# Patient Record
Sex: Male | Born: 1975 | Race: Black or African American | Hispanic: No | Marital: Married | Smoking: Never smoker
Health system: Southern US, Community
[De-identification: ages and names within clinical notes are randomized; demographics above are authoritative.]

---

## 1999-01-16 ENCOUNTER — Emergency Department (HOSPITAL_COMMUNITY): Admission: EM | Admit: 1999-01-16 | Discharge: 1999-01-16 | Payer: Self-pay | Admitting: *Deleted

## 1999-01-16 ENCOUNTER — Encounter: Payer: Self-pay | Admitting: *Deleted

## 1999-01-25 ENCOUNTER — Emergency Department (HOSPITAL_COMMUNITY): Admission: EM | Admit: 1999-01-25 | Discharge: 1999-01-25 | Payer: Self-pay | Admitting: Emergency Medicine

## 2001-01-13 ENCOUNTER — Emergency Department (HOSPITAL_COMMUNITY): Admission: EM | Admit: 2001-01-13 | Discharge: 2001-01-13 | Payer: Self-pay | Admitting: Emergency Medicine

## 2003-11-12 ENCOUNTER — Emergency Department (HOSPITAL_COMMUNITY): Admission: EM | Admit: 2003-11-12 | Discharge: 2003-11-13 | Payer: Self-pay | Admitting: Emergency Medicine

## 2003-12-25 ENCOUNTER — Emergency Department (HOSPITAL_COMMUNITY): Admission: EM | Admit: 2003-12-25 | Discharge: 2003-12-25 | Payer: Self-pay | Admitting: Emergency Medicine

## 2004-12-13 ENCOUNTER — Emergency Department (HOSPITAL_COMMUNITY): Admission: EM | Admit: 2004-12-13 | Discharge: 2004-12-13 | Payer: Self-pay | Admitting: Family Medicine

## 2007-08-16 HISTORY — PX: VASECTOMY: SHX75

## 2010-05-04 ENCOUNTER — Emergency Department (HOSPITAL_COMMUNITY)
Admission: EM | Admit: 2010-05-04 | Discharge: 2010-05-04 | Payer: Self-pay | Source: Home / Self Care | Admitting: Family Medicine

## 2011-12-08 IMAGING — CR DG CHEST 2V
2 series · 2 of 2 positions shown · non-contrast
Comparison: None

CLINICAL DATA: The patient started getting sick last night.  Cough,
chest pain.  Bodyaches, fever of 101.

CHEST - 2 VIEW

[view not recorded (1 of 2)]
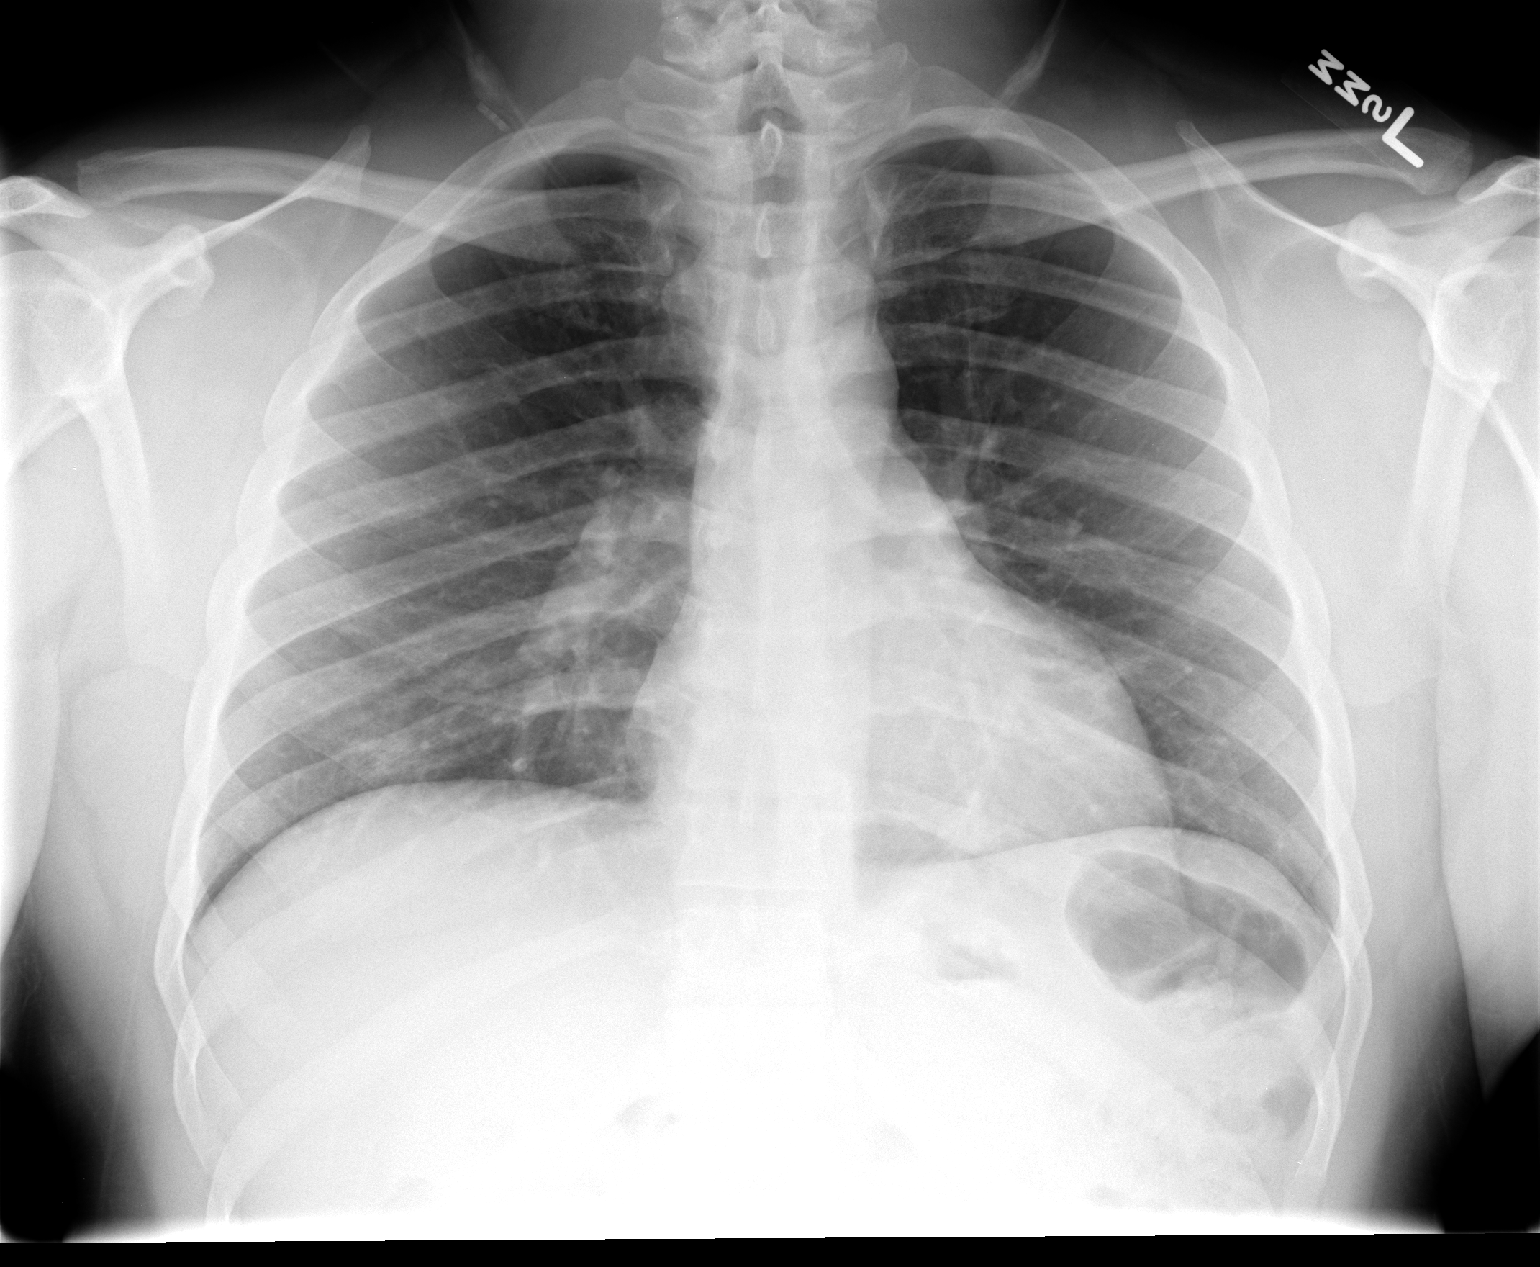

[view not recorded (2 of 2)]
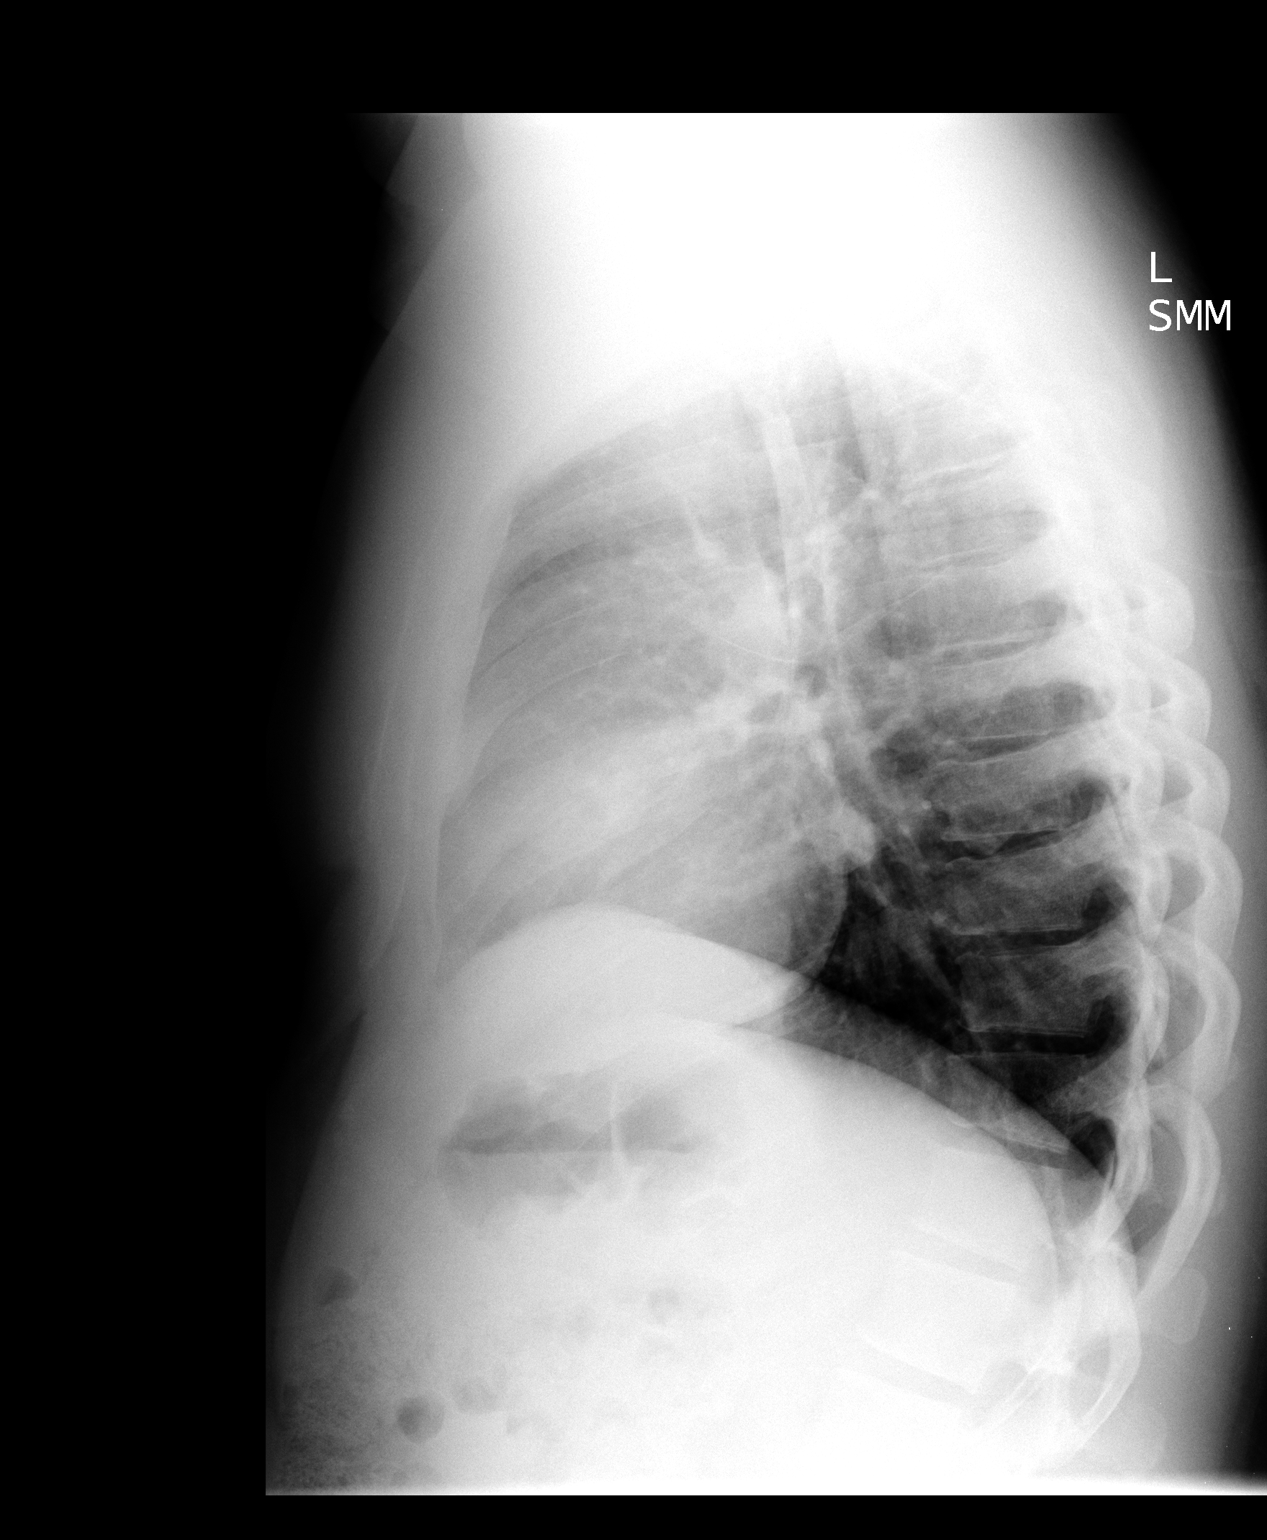

[2 of 2 positions shown; findings below may reference images not displayed]

FINDINGS: The heart is upper limits normal.  No pulmonary edema.
There are no focal consolidations or pleural effusions.  There is
mild wedging of T11 which can be a variant of normal.
IMPRESSION: No evidence for acute  abnormality.

## 2012-08-15 HISTORY — PX: OTHER SURGICAL HISTORY: SHX169

## 2012-10-01 ENCOUNTER — Encounter: Payer: Self-pay | Admitting: Family Medicine

## 2012-10-01 ENCOUNTER — Ambulatory Visit (INDEPENDENT_AMBULATORY_CARE_PROVIDER_SITE_OTHER): Payer: BC Managed Care – PPO | Admitting: Family Medicine

## 2012-10-01 VITALS — BP 112/74 | HR 72 | Temp 98.0°F | Ht 66.0 in | Wt 212.8 lb

## 2012-10-01 DIAGNOSIS — L989 Disorder of the skin and subcutaneous tissue, unspecified: Secondary | ICD-10-CM | POA: Insufficient documentation

## 2012-10-01 DIAGNOSIS — Z23 Encounter for immunization: Secondary | ICD-10-CM

## 2012-10-01 MED ORDER — DOXYCYCLINE HYCLATE 100 MG PO CAPS: 100.0000 mg | ORAL_CAPSULE | Freq: Two times a day (BID) | ORAL | Status: DC

## 2012-10-01 NOTE — Addendum Note (Signed)
Addended by: Josph Macho A on: 10/01/2012 10:20 AM   Modules accepted: Orders

## 2012-10-01 NOTE — Patient Instructions (Signed)
Tdap today. (Tetanus and pertussis) Trial of antibiotic for cysts. Pass by [place]'s office to set up appointment with local dermatologist. Good to see you today, call us with questions. Return in 1-2 yrs for physical, prior fasting for blood work.

## 2012-10-01 NOTE — Progress Notes (Signed)
Subjective:    Patient ID: Bradley Guerrero, male    DOB: [birth year], 37 y.o.   MRN: [id number]  HPI CC: new pt to establish  Boils in back of head, would like evaluated.  Present for several years.  Intermittently come and go.  Has seen dermatologist in past for several years who recommended oral antibiotics, didn't help.  Thinks saw [place].  Would like second opinion.  Uses pantene shampoo, no body lotion.  Uses dove and caress soaps.  Uses degree deodorant.  Uses light hair gel product (prior was using more greasy formula). Has also had boils in groin and under arms.  Usually start draining on their own.    Preventative: No recent CPE. Tetanus shot >10 yrs ago.  Would like Tdap today.  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  Past histories reviewed and updated if relevant as below. There is no problem list on file for this patient.  History reviewed. No pertinent past medical history. Past Surgical History  Procedure Laterality Date  . Vasectomy  2009   History  Substance Use Topics  . Smoking status: Never Smoker   . Smokeless tobacco: Never Used  . Alcohol Use: No   Family History  Problem Relation Age of Onset  . Diabetes Other     paternal great grandfather  . Hypertension Neg Hx   . CAD Neg Hx   . Stroke Neg Hx   . Cancer Neg Hx    No Known Allergies No current outpatient prescriptions on file prior to visit.   No current facility-administered medications on file prior to visit.     Review of Systems  Constitutional: Negative for fever, chills, activity change, appetite change, fatigue and unexpected weight change.  HENT: Negative for hearing loss and neck pain.   Eyes: Negative for visual disturbance.   Respiratory: Negative for cough, chest tightness, shortness of breath and wheezing.   Cardiovascular: Negative for chest pain, palpitations and leg swelling.  Gastrointestinal: Negative for nausea, vomiting, abdominal pain, diarrhea, constipation, blood in stool and abdominal distention.  Genitourinary: Negative for hematuria and difficulty urinating.  Musculoskeletal: Negative for myalgias and arthralgias.  Skin: Negative for rash.  Neurological: Negative for dizziness, seizures, syncope and headaches.  Hematological: Does not bruise/bleed easily.  Psychiatric/Behavioral: Negative for dysphoric mood. The patient is not nervous/anxious.        Objective:   Physical Exam  Nursing note and vitals reviewed. Constitutional: He is oriented to person, place, and time. He appears well-developed and well-nourished. No distress.  HENT:  Head: Normocephalic and atraumatic.  Right Ear: Hearing, tympanic membrane, external ear and ear canal normal.  Left Ear: Hearing, tympanic membrane, external ear and ear canal normal.  Nose: Nose normal.  Mouth/Throat: Oropharynx is clear and moist. No oropharyngeal exudate.  Eyes: Conjunctivae and EOM are normal. Pupils are equal, round, and reactive to light. No scleral icterus.  Neck: Normal range of motion. Neck supple. No thyromegaly present.  Cardiovascular: Normal rate, regular rhythm, normal heart sounds and intact distal pulses.   No murmur heard. Pulses:      Radial pulses are 2+ on the right side, and 2+ on the left side.  Pulmonary/Chest: Effort normal and breath sounds normal. No respiratory distress. He has no wheezes. He has no rales.  Abdominal: Soft. Bowel sounds are normal. He exhibits no distension and no mass. There is no tenderness. There is no rebound and no guarding.  Musculoskeletal: Normal range of motion. He exhibits no edema.  Lymphadenopathy:    He has no cervical adenopathy.  Neurological: He is alert and oriented to person,  place, and time.  CN grossly intact, station and gait intact  Skin: Skin is warm and dry. No rash noted.  Posterior scalp with several lumps, and pustules, some localized alopecia at larger area on right.  No erythema, tenderness, calor or induration L axilla indurated nodule and bilateral axilla with skin thickening present.  Psychiatric: He has a normal mood and affect. His behavior is normal. Judgment and thought content normal.       Assessment & Plan:

## 2012-10-01 NOTE — Assessment & Plan Note (Signed)
?  folliculitis vs hydradenitis variant. Treat as possible folliculitis with course of oral doxy x 10 days.  Discussed using antibacterial soap. Not too consistent with tinea capitis as no scaling. Discussed hair products. Refer back to derm for further recs/treatment plan.

## 2012-10-11 ENCOUNTER — Encounter: Payer: Self-pay | Admitting: Family Medicine

## 2012-10-11 ENCOUNTER — Encounter: Payer: Self-pay | Admitting: *Deleted

## 2012-10-11 ENCOUNTER — Ambulatory Visit (INDEPENDENT_AMBULATORY_CARE_PROVIDER_SITE_OTHER): Payer: BC Managed Care – PPO | Admitting: Family Medicine

## 2012-10-11 VITALS — BP 116/74 | HR 104 | Temp 100.5°F | Wt 215.2 lb

## 2012-10-11 DIAGNOSIS — J029 Acute pharyngitis, unspecified: Secondary | ICD-10-CM

## 2012-10-11 DIAGNOSIS — J020 Streptococcal pharyngitis: Secondary | ICD-10-CM | POA: Insufficient documentation

## 2012-10-11 LAB — POCT RAPID STREP A (OFFICE): Rapid Strep A Screen: POSITIVE — AB

## 2012-10-11 MED ORDER — AMOXICILLIN 875 MG PO TABS: 875.0000 mg | ORAL_TABLET | Freq: Two times a day (BID) | ORAL | Status: DC

## 2012-10-11 NOTE — Progress Notes (Signed)
  Subjective:    Patient ID: Bradley Guerrero, male    DOB: 05-13-1976, 37 y.o.   MRN: [id number]  HPI CC: URI sxs  1d h/o ST, fevers and chills.  Didn't check temp at home.  + congestion and PNdrainage.  + HA and right earache  So far has tried advil.  No abd pain, cough, tooth pain  No smokers at home. No h/o asthma. Did not receive flu shot this year. Wife with sinusitis on augmentin started this week, and daughter with stomach bug this week.  Currently completing 10d course of doxy for scalp folliculitis (started 10/01/2012).  To see derm tomorrow.  History reviewed. No pertinent past medical history.  Review of Systems Per HPI    Objective:   Physical Exam  Nursing note and vitals reviewed. Constitutional: He appears well-developed and well-nourished. No distress.  HENT:  Head: Normocephalic and atraumatic.  Right Ear: Hearing, tympanic membrane, external ear and ear canal normal.  Left Ear: Hearing, tympanic membrane, external ear and ear canal normal.  Nose: Nose normal. No mucosal edema or rhinorrhea. Right sinus exhibits no maxillary sinus tenderness and no frontal sinus tenderness. Left sinus exhibits no maxillary sinus tenderness and no frontal sinus tenderness.  Mouth/Throat: Uvula is midline and mucous membranes are normal. Oropharyngeal exudate, posterior oropharyngeal edema and posterior oropharyngeal erythema present. No tonsillar abscesses.  Swollen tonsils with exudates bilaterally  Eyes: Conjunctivae and EOM are normal. Pupils are equal, round, and reactive to light. No scleral icterus.  Neck: Normal range of motion. Neck supple.  Cardiovascular: Normal rate, regular rhythm, normal heart sounds and intact distal pulses.   No murmur heard. Pulmonary/Chest: Effort normal and breath sounds normal. No respiratory distress. He has no wheezes. He has no rales.  Lymphadenopathy:    He has cervical adenopathy (R AC LAD).      Assessment & Plan:

## 2012-10-11 NOTE — Patient Instructions (Addendum)
You have strep pharyngitis. Take amoxicillin twice daily for 10 days.  May stop doxycycline Push fluids and plenty of rest. May use ibuprofen for throat inflammation. Salt water gargles. Suck on cold things like popsicles or warm things like herbal teas (whichever soothes the throat better). Return if fever >101.5, worsening pain, or trouble opening/closing mouth, or hoarse voice. Good to see you today, call clinic with questions.

## 2012-10-11 NOTE — Assessment & Plan Note (Signed)
4/4 centor criteria. RST positive. Treat with amox. Stop doxy.

## 2012-10-11 NOTE — Addendum Note (Signed)
Addended by: Josph Macho A on: 10/11/2012 09:55 AM   Modules accepted: Orders

## 2012-10-15 ENCOUNTER — Telehealth: Payer: Self-pay | Admitting: Family Medicine

## 2012-10-15 NOTE — Telephone Encounter (Signed)
[facility] Triage Call Report Triage Record Num: [id number] Operator: Ether Griffins Patient Name: Bradley Guerrero Call Date & Time: 10/13/2012 9:19:18PM Patient Phone: [phone] PCP: Eustaquio Boyden Patient Gender: Male PCP Fax : [phone] Patient DOB: [birth year] [facility] Name: [facility] Reason for Call: Caller: [name]/Patient; PCP: Eustaquio Boyden [facility]); CB#: [phone]; Calling about sore throat not getting any better,white on the back of throat,painful to swallow. Saw PCP on 10/11/12--dx with Strep and was given Amoxicillin. Has taken antibx x 3 days. Also taking Ibuprofen without effect. Guideline: Sore Throat or Hoarseness. Disposition: See Lyrick Lagrand Within 4 Hours. Reason for Disposition: Marked difficulty swallowing due to sore throat unresponsive to 12 hours of home care. Contacted Dr Domenic Polite ordered Keflex 500 mg TID x 10 days(no refillls), and advised to take Ibuprofen 800 mg PO QID with food for the next 2 days. Called [facility] [phone] and spoke to pharmacist [place]. Home care advice given. Advised to take Keflex instead of Amoxicillin. Protocol(s) Used: Sore Throat or Hoarseness Recommended Outcome per Protocol: See Geremy Rister within 4 hours Override Outcome if Used in Protocol: Call Rosaline Ezekiel Immediately RN Reason for Override Outcome: Nursing Judgement Used. Reason for Outcome: Marked difficulty swallowing due to sore throat unresponsive to 12 hours of home care Care Advice: ~ SYMPTOM / CONDITION MANAGEMENT Analgesic/Antipyretic Advice - NSAIDs: Consider aspirin, ibuprofen, naproxen or ketoprofen for pain or fever as directed on label or by pharmacist/Amy Belloso. PRECAUTIONS: - If over 33 years of age, should not take longer than 1 week without consulting Sakiyah Shur. EXCEPTIONS: - Should not be used if taking blood thinners or have bleeding problems. - Do not use if have history of sensitivity/allergy to any of  these medications; or history of cardiovascular, ulcer, kidney, liver disease or diabetes unless approved by Willem Klingensmith. - Do not exceed recommended dose or frequency. ~ Sore Throat Relief: - Use salt water gargles (1/2 teaspoon salt in 8 oz. [244mL] warm water) every one to two hours. - Use a vaporizer or cool mist humidifier in the room when sleeping. - Suck on hard candy, nonprescription or herbal throat lozenges (sugar-free if diabetic) - Eat soothing, soft food/fluids (broths, soups, or honey and lemon juice in hot tea, Popsicles, frozen yogurt or sherbet, scrambled eggs, cooked cereals, Jell-O or puddings) whichever is most comforting. - Avoid eating salty, spicy or acidic foods. ~ 10/13/2012 9:48:53PM Page 1 of 1 [id number]

## 2012-10-16 NOTE — Telephone Encounter (Signed)
Noted. Thanks.  amox changed to keflex.

## 2013-02-08 ENCOUNTER — Encounter: Payer: Self-pay | Admitting: Family Medicine

## 2013-02-08 ENCOUNTER — Ambulatory Visit (INDEPENDENT_AMBULATORY_CARE_PROVIDER_SITE_OTHER): Payer: BC Managed Care – PPO | Admitting: Family Medicine

## 2013-02-08 VITALS — BP 106/74 | HR 73 | Temp 98.1°F | Wt 208.5 lb

## 2013-02-08 DIAGNOSIS — H109 Unspecified conjunctivitis: Secondary | ICD-10-CM

## 2013-02-08 NOTE — Progress Notes (Signed)
L eyelid was swollen for about 1 week.  No R or L upper eyelid sx.  Whites of eye not affected.  No crusting but some cloudy discharge from the area.  L lower lid was puffy, was sore, much better now.  Wears goggles at work.  Possible sick contact with pinkeye.    No FCNAV, no rhinorrhea, no ST.  He is much better today.    Meds, vitals, and allergies reviewed.   ROS: See HPI.  Otherwise, noncontributory.  nad ncat Tm wnl Nasal and OP exam wnl PERRL, EOMI, lids and conjunctiva wnl B No discharge ie normal eye/lid exam

## 2013-02-08 NOTE — Patient Instructions (Addendum)
No charge for visit. If you have more symptoms, then let us know.  Take care.

## 2013-02-10 DIAGNOSIS — H109 Unspecified conjunctivitis: Secondary | ICD-10-CM | POA: Insufficient documentation

## 2013-02-10 NOTE — Assessment & Plan Note (Signed)
B normal exam, resolved, no charge for exam. No FB seen.

## 2013-06-18 ENCOUNTER — Encounter: Payer: Self-pay | Admitting: Family Medicine

## 2013-08-28 ENCOUNTER — Ambulatory Visit (INDEPENDENT_AMBULATORY_CARE_PROVIDER_SITE_OTHER): Payer: BC Managed Care – PPO | Admitting: Internal Medicine

## 2013-08-28 ENCOUNTER — Encounter: Payer: Self-pay | Admitting: Internal Medicine

## 2013-08-28 VITALS — BP 120/76 | HR 80 | Temp 98.2°F | Wt 214.0 lb

## 2013-08-28 DIAGNOSIS — J069 Acute upper respiratory infection, unspecified: Secondary | ICD-10-CM

## 2013-08-28 DIAGNOSIS — R52 Pain, unspecified: Secondary | ICD-10-CM

## 2013-08-28 DIAGNOSIS — B9789 Other viral agents as the cause of diseases classified elsewhere: Principal | ICD-10-CM

## 2013-08-28 LAB — POCT INFLUENZA A/B
Influenza A, POC: NEGATIVE
Influenza B, POC: NEGATIVE

## 2013-08-28 MED ORDER — HYDROCODONE-HOMATROPINE 5-1.5 MG/5ML PO SYRP: 5.0000 mL | ORAL_SOLUTION | Freq: Three times a day (TID) | ORAL | Status: DC | PRN

## 2013-08-28 NOTE — Progress Notes (Signed)
HPI: Pt presents to office today with complaints of chills, body aches, and congestion. Symptoms began yesterday and have gotten worse. Pt endorses productive cough,headaches, nasal discharge/congestion, and fatigue. Pt denies shortness of breath, sore throat, ear pain/fullness, or sinus pressure/tenderness. Pt tried Tylenol with little relief. Pt has been around sick contacts.   No Known Allergies  Family History  Problem Relation Age of Onset  . Diabetes Other     paternal great grandfather  . Hypertension Neg Hx   . CAD Neg Hx   . Stroke Neg Hx   . Cancer Neg Hx     History   Social History  . Marital Status: Married    Spouse Name: N/A    Number of Children: N/A  . Years of Education: N/A   Occupational History  . Not on file.   Social History Main Topics  . Smoking status: Never Smoker   . Smokeless tobacco: Never Used  . Alcohol Use: No  . Drug Use: No  . Sexual Activity: Not on file   Other Topics Concern  . 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  HEENT:Endorse nasal discharge and congestoin.  Denies eye pain, eye redness, ear pain, ringing in the ears, wax buildupbloody nose, or sore throat. Respiratory: Endorses cough with sputum production. Denies difficulty breathing, shortness of breath  Cardiovascular: Denies chest pain, chest tightness, palpitations or swelling in the hands or feet.   PE:  BP 120/76  Pulse 80  Temp(Src) 98.2 F (36.8 C) (Oral)  Wt 214 lb (97.07 kg)  SpO2 98% Wt Readings from Last 3 Encounters:  08/28/13 214 lb (97.07 kg)  02/08/13 208 lb 8 oz (94.575 kg)  10/11/12 215 lb 4 oz (97.637 kg)     General: Appears their stated age, well developed, well nourished in NAD. HEENT: Head: normal shape and size; Eyes: sclera white, no icterus, conjunctiva pink, PERRLA and EOMs intact; Ears: Tm's gray and intact, normal light reflex; Nose: mucosa pink and moist, septum midline; Throat/Mouth: Teeth present, mucosa pink and moist, no lesions or ulcerations noted.  Neck: Normal range of motion. Neck supple, trachea midline. No massses, lumps or thyromegaly present.  Cardiovascular: Normal rate and rhythm. S1,S2 noted.  No murmur, rubs or gallops noted. No JVD or BLE edema. No carotid bruits noted. Pulmonary/Chest: Normal effort and positive vesicular breath sounds. No respiratory distress. No wheezes, rales or ronchi noted.  Psychiatric: Mood and affect normal. Behavior is normal. Judgment and thought content normal.    Assessment and Plan: Viral Upper Respiratory Infection: Supportive care: increase fluids and rest Hycodan cough syrup 5ml every 8hrs as needed for cough (no refills) Continue Ibuprofen or Tylenol for fever and body aches Follow up in 5 days if symptoms do not improve.  Leeanne MannanKeatts, Izaak Sahr S, Student-NP

## 2013-08-28 NOTE — Progress Notes (Signed)
HPI  Pt presents to the clinic today with c/o cough, chest congestion, nasal congestion and body aches. This started about 1 day ago. The cough is productive of thick yellow mucous. He has tried ibuprofen and tylenol, without much relief. He has no history of allergies or asthma. He has had sick contacts.  Review of Systems     History reviewed. No pertinent past medical history.  Family History  Problem Relation Age of Onset  . Diabetes Other     paternal great grandfather  . Hypertension Neg Hx   . CAD Neg Hx   . Stroke Neg Hx   . Cancer Neg Hx     History   Social History  . Marital Status: Married    Spouse Name: N/A    Number of Children: N/A  . Years of Education: N/A   Occupational History  . Not on file.   Social History Main Topics  . Smoking status: Never Smoker   . Smokeless tobacco: Never Used  . Alcohol Use: No  . Drug Use: No  . Sexual Activity: Not on file   Other Topics Concern  . 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 Denies fevers or abrupt weight changes.  HEENT:  Positive nasal congestion. Denies eye redness, eye pain, pressure behind the eyes, facial pain, ear pain, ringing in the ears, wax buildup, runny nose or bloody nose. Respiratory: Positive cough. Denies difficulty breathing or shortness of breath.  Cardiovascular: Denies chest pain, chest tightness, palpitations or swelling in the hands or feet.   No other specific complaints in a complete review of systems (except as listed in HPI above).  Objective:   BP 120/76  Pulse 80  Temp(Src) 98.2 F (36.8 C) (Oral)  Wt 214 lb (97.07 kg)  SpO2 98% Wt Readings from Last 3 Encounters:   08/28/13 214 lb (97.07 kg)  02/08/13 208 lb 8 oz (94.575 kg)  10/11/12 215 lb 4 oz (97.637 kg)     General: Appears his stated age, well developed, well nourished in NAD. HEENT: Head: normal shape and size; Eyes: sclera white, no icterus, conjunctiva pink, PERRLA and EOMs intact; Ears: Tm's gray and intact, normal light reflex; Nose: mucosa pink and moist, septum midline; Throat/Mouth: + PND. Teeth present, mucosa erythematous and moist, no exudate noted, no lesions or ulcerations noted.  Neck:  Neck supple, trachea midline. No massses, lumps or thyromegaly present.  Cardiovascular: Normal rate and rhythm. S1,S2 noted.  No murmur, rubs or gallops noted. No JVD or BLE edema. No carotid bruits noted. Pulmonary/Chest: Normal effort and positive vesicular breath sounds. No respiratory distress. No wheezes, rales or ronchi noted.      Assessment & Plan:   Upper respiratory infection, likely viral at this point  Rapid Flu- negative Get some rest and drink plenty of water Do salt water gargles for the sore throat Continue supportive care at home with tylenol  RX for Hycodan cough syrup Work note provided  RTC as needed or if symptoms persist.

## 2013-08-28 NOTE — Patient Instructions (Signed)

## 2013-08-28 NOTE — Progress Notes (Signed)
Pre-visit discussion using our clinic review tool. No additional management support is needed unless otherwise documented below in the visit note.  

## 2015-01-12 ENCOUNTER — Encounter (HOSPITAL_COMMUNITY): Payer: Self-pay | Admitting: Emergency Medicine

## 2015-01-12 ENCOUNTER — Emergency Department (HOSPITAL_COMMUNITY)
Admission: EM | Admit: 2015-01-12 | Discharge: 2015-01-12 | Disposition: A | Discharge status: Home / Self Care | Payer: BLUE CROSS/BLUE SHIELD | Attending: Emergency Medicine | Admitting: Emergency Medicine

## 2015-01-12 DIAGNOSIS — M542 Cervicalgia: Secondary | ICD-10-CM | POA: Diagnosis not present

## 2015-01-12 MED ORDER — KETOROLAC TROMETHAMINE 60 MG/2ML IM SOLN
60.0000 mg | Freq: Once | INTRAMUSCULAR | Status: AC
  Administered 2015-01-12: 60 mg via INTRAMUSCULAR
  Filled 2015-01-12: qty 2

## 2015-01-12 NOTE — ED Provider Notes (Signed)
CSN: [id number]     Arrival date & time 01/12/15  1311 History  This chart was scribed for non-physician practitioner Karmen Stabsori Jamarie Joplin, PA-C, working with Geoffery Lyonsouglas Delo, MD, by Andrew Auaven Small, ED Scribe. This patient was seen in room WTR5/WTR5 and the patient's care was started at 3:20 PM.   Chief Complaint  Patient presents with  . Neck Pain   The history is provided by the patient. No language interpreter was used.   Bradley Guerrero is a 39 y.o. male who presents to the Emergency Department complaining of intermittent, worsening left neck pain for past couple months. Pt describes pain as "pulling" worse at night and when turning head to the left. He has tried advil without relief to pain. He denies neck injury or MVC. Pt denies numbness and tingling.  History reviewed. No pertinent past medical history. Past Surgical History  Procedure Laterality Date  . Vasectomy  2009  . Scalp i&d  2014    Dr. Dawna PartMarks (plastics)   Family History  Problem Relation Age of Onset  . Diabetes Other     paternal great grandfather  . Hypertension Neg Hx   . CAD Neg Hx   . Stroke Neg Hx   . Cancer Neg Hx    History  Substance Use Topics  . Smoking status: Never Smoker   . Smokeless tobacco: Never Used  . Alcohol Use: No    Review of Systems  Constitutional: Negative for fever and chills.  Musculoskeletal: Positive for myalgias and neck pain.  Neurological: Negative for weakness and numbness.   Allergies  Review of patient's allergies indicates no known allergies.  Home Medications   Prior to Admission medications   Medication Sig Start Date End Date Taking? Authorizing Provider  ibuprofen (ADVIL,MOTRIN) 200 MG tablet Take 400 mg by mouth every 6 (six) hours as needed (For neck pain.).   Yes Historical Provider, MD  HYDROcodone-homatropine (HYCODAN) 5-1.5 MG/5ML syrup Take 5 mLs by mouth every 8 (eight) hours as needed for cough. Patient not taking: Reported on 01/12/2015 08/28/13   Lorre Munroeegina W Baity, NP    BP 115/67 mmHg  Pulse 63  Temp(Src) 97.9 F (36.6 C) (Oral)  Resp 16  SpO2 98% Physical Exam  Constitutional: He appears well-developed and well-nourished. No distress.  HENT:  Head: Normocephalic and atraumatic.  Eyes: Conjunctivae are normal. Right eye exhibits no discharge. Left eye exhibits no discharge.  Neck:  No midline cspine tenderness. Full ROM of neck. Tenderness of left neck to distribution of trapezius .  Cardiovascular:  Pulses:      Radial pulses are 2+ on the right side, and 2+ on the left side.  Pulmonary/Chest: Effort normal. No respiratory distress.  Neurological: He is alert. Coordination normal.  5/5 strength in bilateral upper extremities. Sensation intact.  Skin: He is not diaphoretic.  Psychiatric: He has a normal mood and affect. His behavior is normal.  Nursing note and vitals reviewed.   ED Course  Procedures (including critical care time) DIAGNOSTIC STUDIES: Oxygen Saturation is 98% on RA, normal by my interpretation.    COORDINATION OF CARE: 4:09 PM- Pt advised of plan for treatment and pt agrees.  Labs Review Labs Reviewed - No data to display  Imaging Review No results found.   EKG Interpretation None       MDM   Final diagnoses:  Neck pain on left side   Patient with muscle tenderness to left side of neck. No midline C-spine tenderness. Neurovascularly intact. No indication for  imaging. Pain managed in ED. Patient to follow-up with orthopedics for persistent pain.  Discussed return precautions with patient. Discussed all results and patient verbalizes understanding and agrees with plan.  I personally performed the services described in this documentation, which was scribed in my presence. The recorded information has been reviewed and is accurate.    Oswaldo Conroy, PA-C 01/12/15 1610  Geoffery Lyons, MD 01/13/15 [phone]

## 2015-01-12 NOTE — Discharge Instructions (Signed)
Return to the emergency room with worsening of symptoms, new symptoms or with symptoms that are concerning, especially redness, swelling, unable to move neck, fevers, numbness tingling weakness. RICE: Rest, Ice (three cycles of 20 mins on, off at least twice a day), compression/brace, elevation. Heating pad works well for back pain. Ibuprofen  (2 tablets ) every 5-6 hours for 3-5 days. Follow up with orthopedist. Call to make appointment.  Cervical Strain and Sprain (Whiplash) with Rehab Cervical strain and sprain are injuries that commonly occur with "whiplash" injuries. Whiplash occurs when the neck is forcefully whipped backward or forward, such as during a motor vehicle accident or during contact sports. The muscles, ligaments, tendons, discs, and nerves of the neck are susceptible to injury when this occurs. RISK FACTORS Risk of having a whiplash injury increases if:  Osteoarthritis of the spine.  Situations that make head or neck accidents or trauma more likely.  High-risk sports (football, rugby, wrestling, hockey, auto racing, gymnastics, diving, contact karate, or boxing).  Poor strength and flexibility of the neck.  Previous neck injury.  Poor tackling technique.  Improperly fitted or padded equipment. SYMPTOMS   Pain or stiffness in the front or back of neck or both.  Symptoms may present immediately or up to 24 hours after injury.  Dizziness, headache, nausea, and vomiting.  Muscle spasm with soreness and stiffness in the neck.  Tenderness and swelling at the injury site. PREVENTION  Learn and use proper technique (avoid tackling with the head, spearing, and head-butting; use proper falling techniques to avoid landing on the head).  Warm up and stretch properly before activity.  Maintain physical fitness:  Strength, flexibility, and endurance.  Cardiovascular fitness.  Wear properly fitted and padded protective equipment, such as padded soft  collars, for participation in contact sports. PROGNOSIS  Recovery from cervical strain and sprain injuries is dependent on the extent of the injury. These injuries are usually curable in 1 week to 3 months with appropriate treatment.  RELATED COMPLICATIONS   Temporary numbness and weakness may occur if the nerve roots are damaged, and this may persist until the nerve has completely healed.  Chronic pain due to frequent recurrence of symptoms.  Prolonged healing, especially if activity is resumed too soon (before complete recovery). TREATMENT  Treatment initially involves the use of ice and medication to help reduce pain and inflammation. It is also important to perform strengthening and stretching exercises and modify activities that worsen symptoms so the injury does not get worse. These exercises may be performed at home or with a therapist. For patients who experience severe symptoms, a soft, padded collar may be recommended to be worn around the neck.  Improving your posture may help reduce symptoms. Posture improvement includes pulling your chin and abdomen in while sitting or standing. If you are sitting, sit in a firm chair with your buttocks against the back of the chair. While sleeping, try replacing your pillow with a small towel rolled to 2 inches in diameter, or use a cervical pillow or soft cervical collar. Poor sleeping positions delay healing.  For patients with nerve root damage, which causes numbness or weakness, the use of a cervical traction apparatus may be recommended. Surgery is rarely necessary for these injuries. However, cervical strain and sprains that are present at birth (congenital) may require surgery. MEDICATION   If pain medication is necessary, nonsteroidal anti-inflammatory medications, such as aspirin and ibuprofen, or other minor pain relievers, such as acetaminophen, are often recommended.  Do not take pain medication for 7 days before surgery.  Prescription  pain relievers may be given if deemed necessary by your caregiver. Use only as directed and only as much as you need. HEAT AND COLD:   Cold treatment (icing) relieves pain and reduces inflammation. Cold treatment should be applied for 10 to 15 minutes every 2 to 3 hours for inflammation and pain and immediately after any activity that aggravates your symptoms. Use ice packs or an ice massage.  Heat treatment may be used prior to performing the stretching and strengthening activities prescribed by your caregiver, physical therapist, or athletic trainer. Use a heat pack or a warm soak. SEEK MEDICAL CARE IF:   Symptoms get worse or do not improve in 2 weeks despite treatment.  New, unexplained symptoms develop (drugs used in treatment may produce side effects). EXERCISES RANGE OF MOTION (ROM) AND STRETCHING EXERCISES - Cervical Strain and Sprain These exercises may help you when beginning to rehabilitate your injury. In order to successfully resolve your symptoms, you must improve your posture. These exercises are designed to help reduce the forward-head and rounded-shoulder posture which contributes to this condition. Your symptoms may resolve with or without further involvement from your physician, physical therapist or athletic trainer. While completing these exercises, remember:   Restoring tissue flexibility helps normal motion to return to the joints. This allows healthier, less painful movement and activity.  An effective stretch should be held for at least 20 seconds, although you may need to begin with shorter hold times for comfort.  A stretch should never be painful. You should only feel a gentle lengthening or release in the stretched tissue. STRETCH- Axial Extensors  Lie on your back on the floor. You may bend your knees for comfort. Place a rolled-up hand towel or dish towel, about 2 inches in diameter, under the part of your head that makes contact with the floor.  Gently tuck  your chin, as if trying to make a "double chin," until you feel a gentle stretch at the base of your head.  Hold __________ seconds. Repeat __________ times. Complete this exercise __________ times per day.  STRETCH - Axial Extension   Stand or sit on a firm surface. Assume a good posture: chest up, shoulders drawn back, abdominal muscles slightly tense, knees unlocked (if standing) and feet hip width apart.  Slowly retract your chin so your head slides back and your chin slightly lowers. Continue to look straight ahead.  You should feel a gentle stretch in the back of your head. Be certain not to feel an aggressive stretch since this can cause headaches later.  Hold for __________ seconds. Repeat __________ times. Complete this exercise __________ times per day. STRETCH - Cervical Side Bend   Stand or sit on a firm surface. Assume a good posture: chest up, shoulders drawn back, abdominal muscles slightly tense, knees unlocked (if standing) and feet hip width apart.  Without letting your nose or shoulders move, slowly tip your right / left ear to your shoulder until your feel a gentle stretch in the muscles on the opposite side of your neck.  Hold __________ seconds. Repeat __________ times. Complete this exercise __________ times per day. STRETCH - Cervical Rotators   Stand or sit on a firm surface. Assume a good posture: chest up, shoulders drawn back, abdominal muscles slightly tense, knees unlocked (if standing) and feet hip width apart.  Keeping your eyes level with the ground, slowly turn your head until you feel  a gentle stretch along the back and opposite side of your neck.  Hold __________ seconds. Repeat __________ times. Complete this exercise __________ times per day. RANGE OF MOTION - Neck Circles   Stand or sit on a firm surface. Assume a good posture: chest up, shoulders drawn back, abdominal muscles slightly tense, knees unlocked (if standing) and feet hip width  apart.  Gently roll your head down and around from the back of one shoulder to the back of the other. The motion should never be forced or painful.  Repeat the motion 10-20 times, or until you feel the neck muscles relax and loosen. Repeat __________ times. Complete the exercise __________ times per day. STRENGTHENING EXERCISES - Cervical Strain and Sprain These exercises may help you when beginning to rehabilitate your injury. They may resolve your symptoms with or without further involvement from your physician, physical therapist, or athletic trainer. While completing these exercises, remember:   Muscles can gain both the endurance and the strength needed for everyday activities through controlled exercises.  Complete these exercises as instructed by your physician, physical therapist, or athletic trainer. Progress the resistance and repetitions only as guided.  You may experience muscle soreness or fatigue, but the pain or discomfort you are trying to eliminate should never worsen during these exercises. If this pain does worsen, stop and make certain you are following the directions exactly. If the pain is still present after adjustments, discontinue the exercise until you can discuss the trouble with your clinician. STRENGTH - Cervical Flexors, Isometric  Face a wall, standing about 6 inches away. Place a small pillow, a ball about 6-8 inches in diameter, or a folded towel between your forehead and the wall.  Slightly tuck your chin and gently push your forehead into the soft object. Push only with mild to moderate intensity, building up tension gradually. Keep your jaw and forehead relaxed.  Hold 10 to 20 seconds. Keep your breathing relaxed.  Release the tension slowly. Relax your neck muscles completely before you start the next repetition. Repeat __________ times. Complete this exercise __________ times per day. STRENGTH- Cervical Lateral Flexors, Isometric   Stand about 6 inches  away from a wall. Place a small pillow, a ball about 6-8 inches in diameter, or a folded towel between the side of your head and the wall.  Slightly tuck your chin and gently tilt your head into the soft object. Push only with mild to moderate intensity, building up tension gradually. Keep your jaw and forehead relaxed.  Hold 10 to 20 seconds. Keep your breathing relaxed.  Release the tension slowly. Relax your neck muscles completely before you start the next repetition. Repeat __________ times. Complete this exercise __________ times per day. STRENGTH - Cervical Extensors, Isometric   Stand about 6 inches away from a wall. Place a small pillow, a ball about 6-8 inches in diameter, or a folded towel between the back of your head and the wall.  Slightly tuck your chin and gently tilt your head back into the soft object. Push only with mild to moderate intensity, building up tension gradually. Keep your jaw and forehead relaxed.  Hold 10 to 20 seconds. Keep your breathing relaxed.  Release the tension slowly. Relax your neck muscles completely before you start the next repetition. Repeat __________ times. Complete this exercise __________ times per day. POSTURE AND BODY MECHANICS CONSIDERATIONS - Cervical Strain and Sprain Keeping correct posture when sitting, standing or completing your activities will reduce the stress put on  different body tissues, allowing injured tissues a chance to heal and limiting painful experiences. The following are general guidelines for improved posture. Your physician or physical therapist will provide you with any instructions specific to your needs. While reading these guidelines, remember:  The exercises prescribed by your provider will help you have the flexibility and strength to maintain correct postures.  The correct posture provides the optimal environment for your joints to work. All of your joints have less wear and tear when properly supported by a  spine with good posture. This means you will experience a healthier, less painful body.  Correct posture must be practiced with all of your activities, especially prolonged sitting and standing. Correct posture is as important when doing repetitive low-stress activities (typing) as it is when doing a single heavy-load activity (lifting). PROLONGED STANDING WHILE SLIGHTLY LEANING FORWARD When completing a task that requires you to lean forward while standing in one place for a long time, place either foot up on a stationary 2- to 4-inch high object to help maintain the best posture. When both feet are on the ground, the low back tends to lose its slight inward curve. If this curve flattens (or becomes too large), then the back and your other joints will experience too much stress, fatigue more quickly, and can cause pain.  RESTING POSITIONS Consider which positions are most painful for you when choosing a resting position. If you have pain with flexion-based activities (sitting, bending, stooping, squatting), choose a position that allows you to rest in a less flexed posture. You would want to avoid curling into a fetal position on your side. If your pain worsens with extension-based activities (prolonged standing, working overhead), avoid resting in an extended position such as sleeping on your stomach. Most people will find more comfort when they rest with their spine in a more neutral position, neither too rounded nor too arched. Lying on a non-sagging bed on your side with a pillow between your knees, or on your back with a pillow under your knees will often provide some relief. Keep in mind, being in any one position for a prolonged period of time, no matter how correct your posture, can still lead to stiffness. WALKING Walk with an upright posture. Your ears, shoulders, and hips should all line up. OFFICE WORK When working at a desk, create an environment that supports good, upright posture. Without  extra support, muscles fatigue and lead to excessive strain on joints and other tissues. CHAIR:  A chair should be able to slide under your desk when your back makes contact with the back of the chair. This allows you to work closely.  The chair's height should allow your eyes to be level with the upper part of your monitor and your hands to be slightly lower than your elbows.  Body position:  Your feet should make contact with the floor. If this is not possible, use a foot rest.  Keep your ears over your shoulders. This will reduce stress on your neck and low back. Document Released: 08/01/2005 Document Revised: 12/16/2013 Document Reviewed: 11/13/2008 [facility] Patient Information 2015 [place], [place]. This information is not intended to replace advice given to you by your health care provider. Make sure you discuss any questions you have with your health care provider.

## 2015-01-12 NOTE — ED Notes (Signed)
Pt c/o L lateral neck pain radiating into L should, described as an aching pain. Pt sts "my neck has hurt me off and on for awhile now but I woke up this morning and the pain was really bad."  Pt denise injury. Pt sts pain worsens with movement. Pt A&Ox4 and ambulatory. Pt denies numbness, tingling.

## 2015-01-16 ENCOUNTER — Ambulatory Visit (INDEPENDENT_AMBULATORY_CARE_PROVIDER_SITE_OTHER): Payer: BLUE CROSS/BLUE SHIELD | Admitting: Family Medicine

## 2015-01-16 ENCOUNTER — Encounter: Payer: Self-pay | Admitting: *Deleted

## 2015-01-16 ENCOUNTER — Encounter: Payer: Self-pay | Admitting: Family Medicine

## 2015-01-16 VITALS — BP 118/70 | HR 84 | Temp 97.9°F | Wt 205.5 lb

## 2015-01-16 DIAGNOSIS — S161XXD Strain of muscle, fascia and tendon at neck level, subsequent encounter: Secondary | ICD-10-CM

## 2015-01-16 DIAGNOSIS — S161XXA Strain of muscle, fascia and tendon at neck level, initial encounter: Secondary | ICD-10-CM | POA: Insufficient documentation

## 2015-01-16 MED ORDER — METHOCARBAMOL 500 MG PO TABS: 500.0000 mg | ORAL_TABLET | Freq: Three times a day (TID) | ORAL | Status: DC | PRN

## 2015-01-16 MED ORDER — NAPROXEN 500 MG PO TABS: ORAL_TABLET | ORAL | Status: DC

## 2015-01-16 NOTE — Patient Instructions (Signed)
I think you have cervical neck strain with spasm of trapezius muscle on the left. Treat with muscle relaxant (caution it can make you sleepy), anti inflammatory course, and stretching exercises provided today. Also use heating pad. Let us know if not improving with treatment plan.

## 2015-01-16 NOTE — Progress Notes (Signed)
   BP 118/70 mmHg  Pulse 84  Temp(Src) 97.9 F (36.6 C) (Oral)  Wt 205 lb 8 oz (93.214 kg)   CC: ER f/u visit  Subjective:    Patient ID: Bradley Guerrero, male    DOB: Aug 26, 1975, 39 y.o.   MRN: [id number]  HPI: Bradley Guerrero is a 39 y.o. male presenting on 01/16/2015 for Follow-up   Seen at ER 01/12/2015 with several month history of worsening neck pain worse with turning head to left. Dx with cervical neck strain. Treated with IM toradol.   Endorses ongoing left sided neck pain worsening for last 2 weeks affecting ability to sleep at night time. + shooting pain down L shoulder not past elbow. No numbness, paresthesias or weakness of left arm.   No inciting trauma/injury.  Tried icy not, aleve, advil, hot compresses. Temporary relief only.  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  H/o left shoulder discomfort but never neck.   Relevant past medical, surgical, family and social history reviewed and updated as indicated. Interim medical history since our last visit reviewed. Allergies and medications reviewed and updated. Current Outpatient Prescriptions on File Prior to Visit  Medication Sig  . ibuprofen (ADVIL,MOTRIN) 200 MG tablet Take 400 mg by mouth every 6 (six) hours as needed (For neck pain.).   No current facility-administered medications on file prior to visit.    Review of Systems Per HPI unless specifically indicated above     Objective:    BP 118/70 mmHg  Pulse 84  Temp(Src) 97.9 F (36.6 C) (Oral)  Wt 205 lb 8 oz (93.214 kg)  Wt Readings from Last 3 Encounters:  01/16/15 205 lb 8 oz (93.214 kg)  08/28/13 214 lb (97.07 kg)  02/08/13 208 lb 8 oz (94.575 kg)    Physical Exam  Constitutional: He is oriented to person, place, and time. He appears well-developed and well-nourished. No distress.  Neck: Normal range of motion. Neck supple. No thyromegaly present.  Musculoskeletal: He exhibits no edema.  FROM at cervical  neck but tender with forward flexion and lateral flexion to right Tender to palpation midline cervical spine around C5-7 as well as tenderness/tightness at left trap belly  Lymphadenopathy:    He has no cervical adenopathy.  Neurological: He is alert and oriented to person, place, and time. He has normal strength. No sensory deficit.  Sensation intact to temp and light touch bilateral upper extremities 5/5 strength BUE  Nursing note and vitals reviewed.     Assessment & Plan:   Problem List Items Addressed This Visit    Acute strain of neck muscle - Primary    No red flags today, sensation and strength intact. Anticipate MSK cervical neck strain + trap muscle spasm. Treat with robaxin, naprosyn course (discussed sedation precautions) as well as heating pad and stretching exercises from [facility] pt advisor. Update if new sxs develop or worsening pain. Pt agrees with plan.          Follow up plan: Return if symptoms worsen or fail to improve.

## 2015-01-16 NOTE — Progress Notes (Signed)
Pre visit review using our clinic review tool, if applicable. No additional management support is needed unless otherwise documented below in the visit note. 

## 2015-01-16 NOTE — Assessment & Plan Note (Signed)
No red flags today, sensation and strength intact. Anticipate MSK cervical neck strain + trap muscle spasm. Treat with robaxin, naprosyn course (discussed sedation precautions) as well as heating pad and stretching exercises from [facility] pt advisor. Update if new sxs develop or worsening pain. Pt agrees with plan.

## 2015-05-04 ENCOUNTER — Encounter: Payer: Self-pay | Admitting: Family Medicine

## 2015-05-04 ENCOUNTER — Ambulatory Visit (INDEPENDENT_AMBULATORY_CARE_PROVIDER_SITE_OTHER): Payer: BLUE CROSS/BLUE SHIELD | Admitting: Family Medicine

## 2015-05-04 VITALS — BP 112/72 | HR 56 | Temp 97.5°F | Wt 202.5 lb

## 2015-05-04 DIAGNOSIS — M2142 Flat foot [pes planus] (acquired), left foot: Secondary | ICD-10-CM

## 2015-05-04 DIAGNOSIS — Z23 Encounter for immunization: Secondary | ICD-10-CM | POA: Diagnosis not present

## 2015-05-04 DIAGNOSIS — M25572 Pain in left ankle and joints of left foot: Secondary | ICD-10-CM | POA: Diagnosis not present

## 2015-05-04 DIAGNOSIS — M2141 Flat foot [pes planus] (acquired), right foot: Secondary | ICD-10-CM | POA: Diagnosis not present

## 2015-05-04 MED ORDER — NAPROXEN 500 MG PO TABS: ORAL_TABLET | ORAL | Status: DC

## 2015-05-04 NOTE — Assessment & Plan Note (Signed)
Severe. Poor foot mechanics bilaterally. Interested in eval with sports med to discuss custom orthotics. Pt will schedule appt with Dr Patsy Lager.

## 2015-05-04 NOTE — Addendum Note (Signed)
Addended by: Josph Macho A on: 05/04/2015 11:41 AM   Modules accepted: Orders

## 2015-05-04 NOTE — Patient Instructions (Addendum)
Flu shot today. Possible tendonitis from poorly fitting shoes. I'm glad you're using new shoes. For flat foot - schedule appointment with Dr Patsy Lager for evaluation of custom orthotics. Bring your work shoes to that appointment.

## 2015-05-04 NOTE — Progress Notes (Signed)
Pre visit review using our clinic review tool, if applicable. No additional management support is needed unless otherwise documented below in the visit note. 

## 2015-05-04 NOTE — Assessment & Plan Note (Signed)
Anticipate has developed mild tendonitis from poorly fitting steel-toed shoes recently. He has already changed shoes. rec short nsaid course + ice to ankle. Update if not improved with this. Not consistent with ankle sprain or with stress fracture.

## 2015-05-04 NOTE — Progress Notes (Addendum)
   BP 112/72 mmHg  Pulse 56  Temp(Src) 97.5 F (36.4 C) (Oral)  Wt 202 lb 8 oz (91.853 kg)   CC: ankle pain  Subjective:    Patient ID: Bradley Guerrero, male    DOB: Dec 13, 1975, 39 y.o.   MRN: [id number]  HPI: Bradley Guerrero is a 39 y.o. male presenting on 05/04/2015 for Ankle Pain   New steel-toed boots at work - trying to break them in. For last 2 weeks noticing lateral ankle pain and swelling worse after a day at work. Denies inciting trauma/injury or fall. Bought different shoe which feels better, but still persistent pain.  Also noticing great toenail on left turning purple.   No h/o foot issues in the past. Denies foot pains, knee or hip pain in the past.  Relevant past medical, surgical, family and social history reviewed and updated as indicated. Interim medical history since our last visit reviewed. Allergies and medications reviewed and updated. Current Outpatient Prescriptions on File Prior to Visit  Medication Sig  . ibuprofen (ADVIL,MOTRIN) 200 MG tablet Take 400 mg by mouth every 6 (six) hours as needed (For neck pain.).  Marland Kitchen methocarbamol (ROBAXIN) 500 MG tablet Take 1 tablet (500 mg total) by mouth 3 (three) times daily as needed for muscle spasms. (Patient not taking: Reported on 05/04/2015)   No current facility-administered medications on file prior to visit.    Review of Systems Per HPI unless specifically indicated above     Objective:    BP 112/72 mmHg  Pulse 56  Temp(Src) 97.5 F (36.4 C) (Oral)  Wt 202 lb 8 oz (91.853 kg)  Wt Readings from Last 3 Encounters:  05/04/15 202 lb 8 oz (91.853 kg)  01/16/15 205 lb 8 oz (93.214 kg)  08/28/13 214 lb (97.07 kg)    Physical Exam  Constitutional: He appears well-developed and well-nourished. No distress.  Musculoskeletal: He exhibits no edema.  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 Some transverse arch loss as well.   Skin: Skin is warm and  dry. No rash noted.  Psychiatric: He has a normal mood and affect.  Nursing note and vitals reviewed.     Assessment & Plan:   Problem List Items Addressed This Visit    Left ankle pain - Primary    Anticipate has developed mild tendonitis from poorly fitting steel-toed shoes recently. He has already changed shoes. rec short nsaid course + ice to ankle. Update if not improved with this. Not consistent with ankle sprain or with stress fracture.      Pes planus of both feet    Severe. Poor foot mechanics bilaterally. Interested in eval with sports med to discuss custom orthotics. Pt will schedule appt with Dr Patsy Lager.          Follow up plan: No Follow-up on file.

## 2015-05-13 ENCOUNTER — Encounter: Payer: Self-pay | Admitting: Family Medicine

## 2015-05-13 ENCOUNTER — Encounter (INDEPENDENT_AMBULATORY_CARE_PROVIDER_SITE_OTHER): Payer: Self-pay

## 2015-05-13 ENCOUNTER — Ambulatory Visit (INDEPENDENT_AMBULATORY_CARE_PROVIDER_SITE_OTHER): Payer: BLUE CROSS/BLUE SHIELD | Admitting: Family Medicine

## 2015-05-13 VITALS — BP 100/70 | HR 58 | Temp 98.3°F | Ht 66.0 in | Wt 203.5 lb

## 2015-05-13 DIAGNOSIS — M2141 Flat foot [pes planus] (acquired), right foot: Secondary | ICD-10-CM

## 2015-05-13 DIAGNOSIS — M25572 Pain in left ankle and joints of left foot: Secondary | ICD-10-CM | POA: Diagnosis not present

## 2015-05-13 DIAGNOSIS — M2142 Flat foot [pes planus] (acquired), left foot: Secondary | ICD-10-CM | POA: Diagnosis not present

## 2015-05-13 DIAGNOSIS — M76822 Posterior tibial tendinitis, left leg: Secondary | ICD-10-CM | POA: Diagnosis not present

## 2015-05-13 NOTE — Progress Notes (Signed)
Pre visit review using our clinic review tool, if applicable. No additional management support is needed unless otherwise documented below in the visit note. 

## 2015-05-13 NOTE — Progress Notes (Signed)
Dr. Karleen Hampshire T. Copland, MD, CAQ [facility] [address] [place], [address] Phone: [phone] Fax: [phone]  05/13/2015  Patient: Bradley Guerrero, MRN: [id number], DOB: [birth year], 39 y.o.  Primary Physician:  Eustaquio Boyden, MD  Chief Complaint: Foot Orthotics  Subjective:   Bradley Guerrero is a 39 y.o. 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  He is been having some problems with his feet off and on for quite some time, and 2 or 3 weeks ago he sustained an injury in his left foot and is been having problems around his posterior tibialis region.  He has extensive pes planus.  My partner recommended that he, and see me for consideration of formal custom orthotics.  L foot pain Pes planus Post tib tendinopathy  Size 10 f4  Past Medical History, Surgical History, Social History, Family History, Problem List, Medications, and Allergies have been reviewed and updated if relevant.  Patient Active Problem List   Diagnosis Date Noted  . Left ankle pain 05/04/2015  . Pes planus of both feet 05/04/2015    No past medical history on file.  Past Surgical History  Procedure Laterality Date  . Vasectomy  2009  . Scalp i&d  2014    Dr. Dawna Part (plastics)    Social History   Social History  . Marital Status: Married    Spouse Name: N/A  . Number of Children: N/A  . Years of Education: N/A   Occupational History  . Not on file.   Social History Main Topics  . Smoking status: Never Smoker   . Smokeless tobacco: Never Used  . Alcohol Use: No  . Drug Use: No  . Sexual Activity: Not on file   Other Topics Concern  . 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 Diabetes Other     paternal great grandfather  . Hypertension Neg Hx   . CAD Neg Hx   . Stroke Neg Hx   . Cancer Neg Hx     No Known Allergies  Medication list reviewed and updated in full in [facility].  GEN: No fevers, chills. Nontoxic. Primarily MSK c/o today. MSK: Detailed in the HPI GI: tolerating PO intake without difficulty Neuro: No numbness, parasthesias, or tingling associated. Otherwise the pertinent positives of the ROS are noted above.   Objective:   BP 100/70 mmHg  Pulse 58  Temp(Src) 98.3 F (36.8 C) (Oral)  Ht  (1.676 m)  Wt 203 lb 8 oz (92.307 kg)  BMI 32.86 kg/m2   GEN: WDWN, NAD, Non-toxic, Alert & Oriented x 3 HEENT: Atraumatic, Normocephalic.  Ears and Nose: No external deformity. EXTR: No clubbing/cyanosis/edema NEURO: Normal gait.  PSYCH: Normally interactive. Conversant. Not depressed or anxious appearing.  Calm demeanor.   FEET: B Echymosis: no Edema: no ROM: full LE B Gait: heel toe, non-antalgic, pronation MT pain: no Callus pattern: none Lateral Mall: NT Medial Mall: NT Talus: NT Navicular: NT Cuboid: NT Calcaneous: NT Metatarsals: NT 5th MT: NT Phalanges: NT Achilles: NT  Plantar Fascia: NT Fat Pad: NT Peroneals: NT Post Tib: mild TTP Great Toe: Nml motion Ant Drawer: neg ATFL: NT CFL: NT Deltoid: NT Other foot breakdown: none Long arch: dramatic b pes planus Transverse arch: significant B transverse arch collapse, as well Hindfoot breakdown: none Sensation: intact   Radiology: No results found.  Assessment and Plan:   Left ankle pain  Pes planus of both feet  Tibialis tendinitis, left   >40 minutes spent in face to face time with patient, >50% spent in counselling or coordination of care: spent in discussion of gait, foot mechanics, and orthotic manufacturing.   Patient was  fitted for a standard, cushioned, semi-rigid orthotic.  The orthotic was heated and the patient stood on the orthotic blank positioned on the orthotic stand. The patient was positioned in subtalar neutral position and 10 degrees of ankle dorsiflexion in a weight bearing stance. After molding, a stable [employer] EVA base was applied to the orthotic blank.   The blank was ground to a stable position for weight bearing. Size: 10 men's red base: F4 posting: none additional orthotic padding: none   Signed,  Spencer T. Copland, MD   Patient's Medications  New Prescriptions   No medications on file  Previous Medications   IBUPROFEN (ADVIL,MOTRIN) 200 MG TABLET    Take 400 mg by mouth every 6 (six) hours as needed (For neck pain.).   METHOCARBAMOL (ROBAXIN) 500 MG TABLET    Take 1 tablet (500 mg total) by mouth 3 (three) times daily as needed for muscle spasms.   NAPROXEN (NAPROSYN) 500 MG TABLET    Take one po bid x 5 days then prn pain, take with food  Modified Medications   No medications on file  Discontinued Medications   No medications on file

## 2015-10-06 ENCOUNTER — Ambulatory Visit (INDEPENDENT_AMBULATORY_CARE_PROVIDER_SITE_OTHER): Payer: BLUE CROSS/BLUE SHIELD | Admitting: Internal Medicine

## 2015-10-06 ENCOUNTER — Encounter: Payer: Self-pay | Admitting: Internal Medicine

## 2015-10-06 VITALS — BP 116/74 | HR 84 | Temp 98.8°F | Wt 203.0 lb

## 2015-10-06 DIAGNOSIS — J020 Streptococcal pharyngitis: Secondary | ICD-10-CM | POA: Diagnosis not present

## 2015-10-06 MED ORDER — AMOXICILLIN 500 MG PO CAPS: 500.0000 mg | ORAL_CAPSULE | Freq: Three times a day (TID) | ORAL | Status: DC

## 2015-10-06 NOTE — Progress Notes (Signed)
Subjective:    Patient ID: Bradley Guerrero, male    DOB: May 21, 1976, 40 y.o.   MRN: [id number]  HPI  Pt presents to the clinic today with c/o sore throat and chills. This started 2 days ago. He has had some difficulty swallowing. He denies runny nose, ear pain or cough. He denies fever or body aches. He has taken Tylenol with minimal relief. He has no history of seasonal allergies. He has had sick contacts diagnosed with strep.  Review of Systems      History reviewed. No pertinent past medical history.  Current Outpatient Prescriptions  Medication Sig Dispense Refill  . ibuprofen (ADVIL,MOTRIN) 200 MG tablet Take 400 mg by mouth every 6 (six) hours as needed (For neck pain.).     No current facility-administered medications for this visit.    No Known Allergies  Family History  Problem Relation Age of Onset  . Diabetes Other     paternal great grandfather  . Hypertension Neg Hx   . CAD Neg Hx   . Stroke Neg Hx   . Cancer Neg Hx     Social History   Social History  . Marital Status: Married    Spouse Name: N/A  . Number of Children: N/A  . Years of Education: N/A   Occupational History  . Not on file.   Social History Main Topics  . Smoking status: Never Smoker   . Smokeless tobacco: Never Used  . Alcohol Use: No  . Drug Use: No  . Sexual Activity: Not on file   Other Topics Concern  . 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 Denies fever, fatigue, headache or abrupt weight changes.  HEENT: Pt reports sore throat. Denies eye pain, eye redness, ear pain, ringing in the ears, wax buildup, runny nose, nasal congestion, bloody nose. Respiratory: Denies difficulty breathing, shortness of  breath, cough or sputum production.   Cardiovascular: Denies chest pain, chest tightness, palpitations or swelling in the hands or feet.   No other specific complaints in a complete review of systems (except as listed in HPI above).  Objective:   Physical Exam  BP 116/74 mmHg  Pulse 84  Temp(Src) 98.8 F (37.1 C) (Oral)  Wt 203 lb (92.08 kg)  SpO2 99% Wt Readings from Last 3 Encounters:  10/06/15 203 lb (92.08 kg)  05/13/15 203 lb 8 oz (92.307 kg)  05/04/15 202 lb 8 oz (91.853 kg)    General: Appears his stated age, in NAD.Marland Kitchen HEENT: Head: normal shape and size; Throat/Mouth: Teeth present, mucosa erythematous and moist, patchy exudate noted on bilateral tonsils, no lesions or ulcerations noted.  Neck:  Bilateral cervical adenopathy noted.  Cardiovascular: Normal rate and rhythm. S1,S2 noted.  No murmur, rubs or gallops noted.  Pulmonary/Chest: Normal effort and positive vesicular breath sounds. No respiratory distress. No wheezes, rales or ronchi noted.      Assessment & Plan:   Strep throat:  RST positive eRx for Amoxil 500 mg TID x 10 days Ibuprofen as needed for fever/inflammation Salt water gargles for the sore throat Wash hands, don't drink after anyone and cover your cough to prevent spread of infection Work note provided  RTC as needed or if symptoms persist or worsen

## 2015-10-06 NOTE — Progress Notes (Signed)
Pre visit review using our clinic review tool, if applicable. No additional management support is needed unless otherwise documented below in the visit note. 

## 2015-10-06 NOTE — Patient Instructions (Signed)

## 2016-03-10 ENCOUNTER — Encounter: Payer: Self-pay | Admitting: Primary Care

## 2016-03-10 ENCOUNTER — Ambulatory Visit (INDEPENDENT_AMBULATORY_CARE_PROVIDER_SITE_OTHER): Payer: BLUE CROSS/BLUE SHIELD | Admitting: Primary Care

## 2016-03-10 VITALS — BP 124/84 | HR 64 | Temp 97.9°F | Ht 66.0 in | Wt 212.1 lb

## 2016-03-10 DIAGNOSIS — M25531 Pain in right wrist: Secondary | ICD-10-CM | POA: Diagnosis not present

## 2016-03-10 MED ORDER — NAPROXEN 500 MG PO TABS: 500.0000 mg | ORAL_TABLET | Freq: Two times a day (BID) | ORAL | 0 refills | Status: DC

## 2016-03-10 NOTE — Progress Notes (Signed)
Pre visit review using our clinic review tool, if applicable. No additional management support is needed unless otherwise documented below in the visit note. 

## 2016-03-10 NOTE — Patient Instructions (Signed)
Your symptoms are representative of carpal tunnel syndrome. Take a look at the information below.  You will need to rest your wrist and forearm for the next several days. Wear the splint as instructed while working during the day.  You may take Naproxen tablets as needed for pain and inflammation. Take 1 tablet by mouth twice daily with food as needed.  Please notify myself or Dr. Sharen Hones if no improvement in 1-2 weeks.  It was a pleasure meeting you!    Carpal Tunnel Syndrome Carpal tunnel syndrome is a condition that causes pain in your hand and arm. The carpal tunnel is a narrow area located on the palm side of your wrist. Repeated wrist motion or certain diseases may cause swelling within the tunnel. This swelling pinches the main nerve in the wrist (median nerve). CAUSES  This condition may be caused by:   Repeated wrist motions.  Wrist injuries.  Arthritis.  A cyst or tumor in the carpal tunnel.  Fluid buildup during pregnancy. Sometimes the cause of this condition is not known.  RISK FACTORS This condition is more likely to develop in:   People who have jobs that cause them to repeatedly move their wrists in the same motion, such as butchers and cashiers.  Women.  People with certain conditions, such as:  Diabetes.  Obesity.  An underactive thyroid (hypothyroidism).  Kidney failure. SYMPTOMS  Symptoms of this condition include:   A tingling feeling in your fingers, especially in your thumb, index, and middle fingers.  Tingling or numbness in your hand.  An aching feeling in your entire arm, especially when your wrist and elbow are bent for long periods of time.  Wrist pain that goes up your arm to your shoulder.  Pain that goes down into your palm or fingers.  A weak feeling in your hands. You may have trouble grabbing and holding items. Your symptoms may feel worse during the night.  DIAGNOSIS  This condition is diagnosed with a medical history  and physical exam. You may also have tests, including:   An electromyogram (EMG). This test measures electrical signals sent by your nerves into the muscles.  X-rays. TREATMENT  Treatment for this condition includes:  Lifestyle changes. It is important to stop doing or modify the activity that caused your condition.  Physical or occupational therapy.  Medicines for pain and inflammation. This may include medicine that is injected into your wrist.  A wrist splint.  Surgery. HOME CARE INSTRUCTIONS  If You Have a Splint:  Wear it as told by your health care provider. Remove it only as told by your health care provider.  Loosen the splint if your fingers become numb and tingle, or if they turn cold and blue.  Keep the splint clean and dry. General Instructions  Take over-the-counter and prescription medicines only as told by your health care provider.  Rest your wrist from any activity that may be causing your pain. If your condition is work related, talk to your employer about changes that can be made, such as getting a wrist pad to use while typing.  If directed, apply ice to the painful area:  Put ice in a plastic bag.  Place a towel between your skin and the bag.  Leave the ice on for 20 minutes, 2-3 times per day.  Keep all follow-up visits as told by your health care provider. This is important.  Do any exercises as told by your health care provider, physical therapist, or occupational  therapist. SEEK MEDICAL CARE IF:   You have new symptoms.  Your pain is not controlled with medicines.  Your symptoms get worse.   This information is not intended to replace advice given to you by your health care provider. Make sure you discuss any questions you have with your health care provider.   Document Released: 07/29/2000 Document Revised: 04/22/2015 Document Reviewed: 12/17/2014 Elsevier Interactive Patient Education [employer]

## 2016-03-10 NOTE — Progress Notes (Signed)
Subjective:    Patient ID: Bradley Guerrero, male    DOB: 1975/12/23, 40 y.o.   MRN: [id number]  HPI  Bradley Guerrero is a 40 year old male who is right hand dominant who presents today with a chief complaint of wrist pain. He also reports swelling. His discomfort is located to the anterior (anatomical position) right wrist and (posterior)  forearm that have been present for the past 2-3 days. He describes his pain as sharp, shock-like that will radiate from his wrist to his forearm. He also reports numbness/tingling intermittently.  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  Denies recent injury/trauma, changes in foods or activities. He's taken Advil over the last several days without much improvement.   Review of Systems  Constitutional: Negative for fever.  Musculoskeletal:       Right wrist and forearm pain and swelling.  Skin: Negative for color change.       No past medical history on file.   Social History   Social History  . Marital status: Married    Spouse name: N/A  . Number of children: N/A  . Years of education: N/A   Occupational History  . Not on file.   Social History Main Topics  . Smoking status: Never Smoker  . Smokeless tobacco: Never Used  . Alcohol use No  . Drug use: No  . Sexual activity: Not on file   Other Topics Concern  . 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 scalp i&d  2014   Dr. Dawna Part (plastics)  . VASECTOMY  2009    Family History  Problem Relation Age of Onset  . Diabetes Other     paternal great grandfather  . Hypertension Neg Hx   . CAD Neg Hx   . Stroke Neg Hx   . Cancer Neg Hx      No Known Allergies  No current outpatient prescriptions on file prior to visit.   No current facility-administered medications on file prior to visit.     BP 124/84 (BP Location: Left Arm, Patient Position: Sitting, Cuff Size: Normal)   Pulse 64   Temp 97.9 F (36.6 C) (Oral)   Ht 5\' 6"  (1.676 m)   Wt 212 lb 1.9 oz (96.2 kg)   SpO2 98%   BMI 34.24 kg/m    Objective:   Physical Exam  Constitutional: He appears well-nourished.  Cardiovascular: Normal rate and regular rhythm.   Pulses:      Radial pulses are 2+ on the right side, and 2+ on the left side.  Pulmonary/Chest: Effort normal and breath sounds normal.  Musculoskeletal:       Right wrist: He exhibits decreased range of motion and swelling. He exhibits no tenderness and no deformity.       Right forearm: He exhibits tenderness, swelling and edema. He exhibits no deformity.  Skin: Skin is warm and dry. No erythema.          Assessment & Plan:  Wrist Pain And swelling:  Present for the last 3 days, located to right anterior wrist and posterior forearm. No  recent injury or trauma. 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 Exam today with negative Tinel but a positive Phalen sign. Mild to moderate swelling. Normal radial pulses. Suspect carpal tunnel due to repetitive use with hands, especially given that he is right-hand dominant. Will treat with conservative measures at this time. Wrist splint provided in office today with instructions to wear during working hours. We'll also provide work note for him to rest the next 2 days. Prescription for naproxen twice a day when necessary sent to pharmacy. He will notify myself or PCP if no improvement 2 weeks.  Morrie Sheldon, NP

## 2016-03-17 ENCOUNTER — Ambulatory Visit: Payer: BLUE CROSS/BLUE SHIELD | Admitting: Primary Care

## 2016-03-17 ENCOUNTER — Telehealth: Payer: Self-pay | Admitting: Family Medicine

## 2016-03-17 DIAGNOSIS — Z0289 Encounter for other administrative examinations: Secondary | ICD-10-CM

## 2016-03-17 NOTE — Telephone Encounter (Signed)
Patient did not come in for their appointment today for wrist pain. Please let me know if patient needs to be contacted immediately for follow up or no follow up needed.

## 2016-03-17 NOTE — Telephone Encounter (Signed)
No immediate followup needed 

## 2017-09-06 ENCOUNTER — Ambulatory Visit (INDEPENDENT_AMBULATORY_CARE_PROVIDER_SITE_OTHER): Payer: BLUE CROSS/BLUE SHIELD | Admitting: Family Medicine

## 2017-09-06 ENCOUNTER — Encounter: Payer: Self-pay | Admitting: Family Medicine

## 2017-09-06 VITALS — BP 118/84 | HR 66 | Temp 98.0°F | Ht 65.5 in | Wt 214.0 lb

## 2017-09-06 DIAGNOSIS — E66812 Obesity, class 2: Secondary | ICD-10-CM

## 2017-09-06 DIAGNOSIS — Z0000 Encounter for general adult medical examination without abnormal findings: Secondary | ICD-10-CM | POA: Insufficient documentation

## 2017-09-06 DIAGNOSIS — Z23 Encounter for immunization: Secondary | ICD-10-CM

## 2017-09-06 DIAGNOSIS — M25831 Other specified joint disorders, right wrist: Secondary | ICD-10-CM | POA: Insufficient documentation

## 2017-09-06 DIAGNOSIS — E669 Obesity, unspecified: Secondary | ICD-10-CM | POA: Diagnosis not present

## 2017-09-06 HISTORY — DX: Obesity, class 2: E66.812

## 2017-09-06 LAB — COMPREHENSIVE METABOLIC PANEL
ALBUMIN: 4.1 g/dL (ref 3.5–5.2)
ALK PHOS: 46 U/L (ref 39–117)
ALT: 25 U/L (ref 0–53)
AST: 26 U/L (ref 0–37)
BUN: 21 mg/dL (ref 6–23)
CALCIUM: 9.7 mg/dL (ref 8.4–10.5)
CO2: 29 mEq/L (ref 19–32)
CREATININE: 1.09 mg/dL (ref 0.40–1.50)
Chloride: 103 mEq/L (ref 96–112)
GFR: 96.15 mL/min (ref >60.00)
Glucose, Bld: 95 mg/dL (ref 70–99)
Potassium: 3.9 mEq/L (ref 3.5–5.1)
Sodium: 141 mEq/L (ref 135–145)
TOTAL PROTEIN: 8.1 g/dL (ref 6.0–8.3)
Total Bilirubin: 0.4 mg/dL (ref 0.2–1.2)

## 2017-09-06 LAB — LIPID PANEL
CHOLESTEROL: 201 mg/dL — AB (ref 0–200)
HDL: 38.1 mg/dL — AB (ref >39.00)
LDL Cholesterol: 139 mg/dL — ABNORMAL HIGH (ref 0–99)
NonHDL: 162.77
TRIGLYCERIDES: 117 mg/dL (ref 0.0–149.0)
Total CHOL/HDL Ratio: 5
VLDL: 23.4 mg/dL (ref 0.0–40.0)

## 2017-09-06 LAB — TSH: TSH: 1.09 u[IU]/mL (ref 0.35–4.50)

## 2017-09-06 NOTE — Assessment & Plan Note (Signed)
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 Declines hand surgery eval, will monitor for now.

## 2017-09-06 NOTE — Progress Notes (Signed)
BP 118/84 (BP Location: Left Arm, Patient Position: Sitting, Cuff Size: Large)   Pulse 66   Temp 98 F (36.7 C) (Oral)   Ht 5' 5.5" (1.664 m)   Wt 214 lb (97.1 kg)   SpO2 99%   BMI 35.07 kg/m    CC: CPE Subjective:    Patient ID: Bradley Guerrero, male    DOB: 09/09/1975, 42 y.o.   MRN: [id number]  HPI: Bradley Guerrero is a 42 y.o. male presenting on 09/06/2017 for Annual Exam   Preventative: Flu shot today Tdap 2014 Seat belt use discussed Sunscreen use discussed. 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 Interim medical history since our last visit reviewed. Allergies and medications reviewed and updated. Outpatient Medications Prior to Visit  Medication Sig Dispense Refill  . naproxen (NAPROSYN) 500 MG tablet Take 1 tablet (500 mg total) by mouth 2 (two) times daily with a meal. 30 tablet 0   No facility-administered medications prior to visit.      Per HPI unless specifically indicated in ROS section below Review of Systems  Constitutional: Negative for activity change, appetite change, chills, fatigue, fever and unexpected weight change.  HENT: Negative for hearing loss.   Eyes: Negative for visual disturbance.  Respiratory: Negative for cough, chest tightness, shortness of breath and wheezing.   Cardiovascular: Negative for chest pain, palpitations and leg swelling.  Gastrointestinal: Negative for abdominal distention, abdominal pain, blood in stool, constipation, diarrhea, nausea and vomiting.  Genitourinary: Negative for difficulty urinating and hematuria.  Musculoskeletal: Negative for arthralgias, myalgias and neck pain.    Skin: Negative for rash.  Neurological: Negative for dizziness, seizures, syncope and headaches.  Hematological: Negative for adenopathy. Does not bruise/bleed easily.  Psychiatric/Behavioral: Negative for dysphoric mood. The patient is not nervous/anxious.        Objective:    BP 118/84 (BP Location: Left Arm, Patient Position: Sitting, Cuff Size: Large)   Pulse 66   Temp 98 F (36.7 C) (Oral)   Ht 5' 5.5" (1.664 m)   Wt 214 lb (97.1 kg)   SpO2 99%   BMI 35.07 kg/m   Wt Readings from Last 3 Encounters:  09/06/17 214 lb (97.1 kg)  03/10/16 212 lb 1.9 oz (96.2 kg)  10/06/15 203 lb (92.1 kg)    Physical Exam  Constitutional: He is oriented to person, place, and time. He appears well-developed and well-nourished. No distress.  HENT:  Head: Normocephalic and atraumatic.  Right Ear: Hearing, tympanic membrane, external ear and ear canal normal.  Left Ear: Hearing, tympanic membrane, external ear and ear canal normal.  Nose: Nose normal.  Mouth/Throat: Uvula is midline, oropharynx is clear and moist and mucous membranes are normal. No oropharyngeal exudate, posterior oropharyngeal edema or posterior oropharyngeal erythema.  Eyes: Conjunctivae and EOM are normal. Pupils are equal, round, and reactive to light. No scleral icterus.  Neck: Normal range of motion. Neck supple. No thyromegaly present.  Cardiovascular: Normal rate, regular rhythm, normal heart sounds and intact distal pulses.  No murmur heard. Pulses:      Radial pulses are 2+ on the right side, and 2+ on the left side.  Pulmonary/Chest: Effort normal and breath  sounds normal. No respiratory distress. He has no wheezes. He has no rales.  Abdominal: Soft. Bowel sounds are normal. He exhibits no distension and no mass. There is no tenderness. There is no rebound and no guarding.  Musculoskeletal: Normal range of motion. He exhibits no edema.  R ventral wrist at base of palm with cystic mass, non tender.   Lymphadenopathy:     He has no cervical adenopathy.  Neurological: He is alert and oriented to person, place, and time.  CN grossly intact, station and gait intact  Skin: Skin is warm and dry. No rash noted.  Psychiatric: He has a normal mood and affect. His behavior is normal. Judgment and thought content normal.  Nursing note and vitals reviewed.      Assessment & Plan:   Problem List Items Addressed This Visit    Health maintenance examination - Primary    Preventative protocols reviewed and updated unless pt declined. Discussed healthy diet and lifestyle.       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 Declines hand surgery eval, will monitor for now.       Obesity, Class II, BMI 35-39.9, isolated (see actual BMI)    Discussed healthy diet and lifestyle changes to affect sustainable weight loss. Recommended 150 min/wk of mod intensity aerobic exercise per AHA recs.       Relevant Orders   Lipid panel   Comprehensive metabolic panel   TSH    Other Visit Diagnoses    Need for influenza vaccination       Relevant Orders   Flu Vaccine QUAD 6+ mos PF IM (Fluarix Quad PF) (Completed)       Follow up plan: Return in about 2 years (around 09/07/2019) for annual exam, prior fasting for blood work.  Eustaquio BoydenJavier Srija Southard, MD

## 2017-09-06 NOTE — Assessment & Plan Note (Signed)
Discussed healthy diet and lifestyle changes to affect sustainable weight loss. Recommended 150 min/wk of mod intensity aerobic exercise per AHA recs.

## 2017-09-06 NOTE — Assessment & Plan Note (Signed)
Preventative protocols reviewed and updated unless pt declined. Discussed healthy diet and lifestyle.  

## 2017-09-06 NOTE — Patient Instructions (Addendum)
Flu shot today Labs today.  You are doing well today.  Work on incorporating exercise into routine - goal is 17150min/wk of moderate intensity aerobic exercise  Health Maintenance, Male A healthy lifestyle and preventive care is important for your health and wellness. Ask your health care provider about what schedule of regular examinations is right for you. What should I know about weight and diet? Eat a Healthy Diet  Eat plenty of vegetables, fruits, whole grains, low-fat dairy products, and lean protein.  Do not eat a lot of foods high in solid fats, added sugars, or salt.  Maintain a Healthy Weight Regular exercise can help you achieve or maintain a healthy weight. You should:  Do at least 150 minutes of exercise each week. The exercise should increase your heart rate and make you sweat (moderate-intensity exercise).  Do strength-training exercises at least twice a week.  Watch Your Levels of Cholesterol and Blood Lipids  Have your blood tested for lipids and cholesterol every 5 years starting at 41 years of age. If you are at high risk for heart disease, you should start having your blood tested when you are 41 years old. You may need to have your cholesterol levels checked more often if: ? Your lipid or cholesterol levels are high. ? You are older than 41 years of age. ? You are at high risk for heart disease.  What should I know about cancer screening? Many types of cancers can be detected early and may often be prevented. Lung Cancer  You should be screened every year for lung cancer if: ? You are a current smoker who has smoked for at least 30 years. ? You are a former smoker who has quit within the past 15 years.  Talk to your health care provider about your screening options, when you should start screening, and how often you should be screened.  Colorectal Cancer  Routine colorectal cancer screening usually begins at 41 years of age and should be repeated every 5-10  years until you are 41 years old. You may need to be screened more often if early forms of precancerous polyps or small growths are found. Your health care provider may recommend screening at an earlier age if you have risk factors for colon cancer.  Your health care provider may recommend using home test kits to check for hidden blood in the stool.  A small camera at the end of a tube can be used to examine your colon (sigmoidoscopy or colonoscopy). This checks for the earliest forms of colorectal cancer.  Prostate and Testicular Cancer  Depending on your age and overall health, your health care provider may do certain tests to screen for prostate and testicular cancer.  Talk to your health care provider about any symptoms or concerns you have about testicular or prostate cancer.  Skin Cancer  Check your skin from head to toe regularly.  Tell your health care provider about any new moles or changes in moles, especially if: ? There is a change in a mole's size, shape, or color. ? You have a mole that is larger than a pencil eraser.  Always use sunscreen. Apply sunscreen liberally and repeat throughout the day.  Protect yourself by wearing long sleeves, pants, a wide-brimmed hat, and sunglasses when outside.  What should I know about heart disease, diabetes, and high blood pressure?  If you are 4518-41 years of age, have your blood pressure checked every 3-5 years. If you are 640 years of age  or older, have your blood pressure checked every year. You should have your blood pressure measured twice-once when you are at a hospital or clinic, and once when you are not at a hospital or clinic. Record the average of the two measurements. To check your blood pressure when you are not at a hospital or clinic, you can use: ? An automated blood pressure machine at a pharmacy. ? A home blood pressure monitor.  Talk to your health care provider about your target blood pressure.  If you are between  31-80 years old, ask your health care provider if you should take aspirin to prevent heart disease.  Have regular diabetes screenings by checking your fasting blood sugar level. ? If you are at a normal weight and have a low risk for diabetes, have this test once every three years after the age of 94. ? If you are overweight and have a high risk for diabetes, consider being tested at a younger age or more often.  A one-time screening for abdominal aortic aneurysm (AAA) by ultrasound is recommended for men aged 65-75 years who are current or former smokers. What should I know about preventing infection? Hepatitis B If you have a higher risk for hepatitis B, you should be screened for this virus. Talk with your health care provider to find out if you are at risk for hepatitis B infection. Hepatitis C Blood testing is recommended for:  Everyone born from [birth year] through 1965.  Anyone with known risk factors for hepatitis C.  Sexually Transmitted Diseases (STDs)  You should be screened each year for STDs including gonorrhea and chlamydia if: ? You are sexually active and are younger than 41 years of age. ? You are older than 41 years of age and your health care provider tells you that you are at risk for this type of infection. ? Your sexual activity has changed since you were last screened and you are at an increased risk for chlamydia or gonorrhea. Ask your health care provider if you are at risk.  Talk with your health care provider about whether you are at high risk of being infected with HIV. Your health care provider may recommend a prescription medicine to help prevent HIV infection.  What else can I do?  Schedule regular health, dental, and eye exams.  Stay current with your vaccines (immunizations).  Do not use any tobacco products, such as cigarettes, chewing tobacco, and e-cigarettes. If you need help quitting, ask your health care provider.  Limit alcohol intake to no more than  2 drinks per day. One drink equals 12 ounces of beer, 5 ounces of wine, or 1 ounces of hard liquor.  Do not use street drugs.  Do not share needles.  Ask your health care provider for help if you need support or information about quitting drugs.  Tell your health care provider if you often feel depressed.  Tell your health care provider if you have ever been abused or do not feel safe at home. This information is not intended to replace advice given to you by your health care provider. Make sure you discuss any questions you have with your health care provider. Document Released: 01/28/2008 Document Revised: 03/30/2016 Document Reviewed: 05/05/2015 Elsevier Interactive Patient Education  [employer].

## 2018-02-02 ENCOUNTER — Encounter: Payer: Self-pay | Admitting: Family Medicine

## 2018-02-02 ENCOUNTER — Ambulatory Visit: Payer: BLUE CROSS/BLUE SHIELD | Admitting: Family Medicine

## 2018-02-02 VITALS — BP 120/78 | HR 80 | Temp 98.4°F | Ht 65.5 in | Wt 214.5 lb

## 2018-02-02 DIAGNOSIS — L03317 Cellulitis of buttock: Secondary | ICD-10-CM | POA: Insufficient documentation

## 2018-02-02 DIAGNOSIS — L0231 Cutaneous abscess of buttock: Secondary | ICD-10-CM

## 2018-02-02 MED ORDER — DOXYCYCLINE HYCLATE 100 MG PO TABS: 100.0000 mg | ORAL_TABLET | Freq: Two times a day (BID) | ORAL | 0 refills | Status: DC

## 2018-02-02 NOTE — Progress Notes (Signed)
BP 120/78 (BP Location: Left Arm, Patient Position: Sitting, Cuff Size: Large)   Pulse 80   Temp 98.4 F (36.9 C) (Oral)   Ht 5' 5.5" (1.664 m)   Wt 214 lb 8 oz (97.3 kg)   SpO2 99%   BMI 35.15 kg/m    CC: buttock lesion Subjective:    Patient ID: Bradley Guerrero, Bradley Guerrero    DOB: [birth year], 42 y.o.   MRN: [id number]  HPI: Bradley Guerrero is a 42 y.o. Bradley Guerrero presenting on 02/02/2018 for Cyst (Located between buttocks. Noticed about 2 wks ago. Area is painful. Tried Advil. Pt accompanied by wife.)   2 wks ago noticed bump at crease between buttock, more painful last few days.  H/o hydradenitis suppurativa (axilla, scalp, groin) but never at lower back.  No h/o pilonidal cyst.  Has felt feverish. Treating with ibuprofen.  Decreased appetite, painful defecation.   Relevant past medical, surgical, family and social history reviewed and updated as indicated. Interim medical history since our last visit reviewed. Allergies and medications reviewed and updated. No outpatient medications prior to visit.   No facility-administered medications prior to visit.      Per HPI unless specifically indicated in ROS section below Review of Systems     Objective:    BP 120/78 (BP Location: Left Arm, Patient Position: Sitting, Cuff Size: Large)   Pulse 80   Temp 98.4 F (36.9 C) (Oral)   Ht 5' 5.5" (1.664 m)   Wt 214 lb 8 oz (97.3 kg)   SpO2 99%   BMI 35.15 kg/m   Wt Readings from Last 3 Encounters:  02/02/18 214 lb 8 oz (97.3 kg)  09/06/17 214 lb (97.1 kg)  03/10/16 212 lb 1.9 oz (96.2 kg)    Physical Exam  Constitutional: He appears well-developed and well-nourished. No distress.  Genitourinary:     Genitourinary Comments: Pain and swelling, induration of upper bilateral R>L buttock without marked fluctuance.   Skin: There is erythema.  Nursing note and vitals reviewed.  No results found for: WBC, HGB, HCT, MCV, PLT  Lab Results  Component Value Date   CREATININE 1.09  09/06/2017   BUN 21 09/06/2017   NA 141 09/06/2017   K 3.9 09/06/2017   CL 103 09/06/2017   CO2 29 09/06/2017       Assessment & Plan:   Problem List Items Addressed This Visit    Cellulitis and abscess of buttock - Primary    Cellulitis with developing abscess of buttock vs pilonidal cyst with abscess.  Marked induration. May need I&D and ER evaluation.  Will start doxy antibiotic, sitz baths, warm compresses, ER precautions if not improving with antibiotic. Will also refer to gen surgery for expedited evaluation.       Relevant Orders   Ambulatory referral to General Surgery       Meds ordered this encounter  Medications  . doxycycline (VIBRA-TABS) 100 MG tablet    Sig: Take 1 tablet (100 mg total) by mouth 2 (two) times daily.    Dispense:  20 tablet    Refill:  0   Orders Placed This Encounter  Procedures  . Ambulatory referral to General Surgery    Referral Priority:   Urgent    Referral Type:   Surgical    Referral Reason:   Specialty Services Required    Requested Specialty:   General Surgery    Number of Visits Requested:   1    Follow up plan: Return if symptoms  worsen or fail to improve.  Ria Bush, MD

## 2018-02-02 NOTE — Patient Instructions (Signed)
Start antibiotic doxycycline today. Warm sitz baths for inflammation. Take ibuprofen 400-600mg  three times daily with meals for next few days.  We will refer you to general surgery. If unable to be seen today, go to ER over weekend if fever >101, chills, worsening pain/swelling, or nausea/vomiting.

## 2018-02-02 NOTE — Assessment & Plan Note (Addendum)
Cellulitis with developing abscess of buttock vs pilonidal cyst with abscess.  Marked induration. May need I&D and ER evaluation.  Will start doxy antibiotic, sitz baths, warm compresses, ER precautions if not improving with antibiotic. Will also refer to gen surgery for expedited evaluation.

## 2019-06-12 ENCOUNTER — Encounter: Payer: Self-pay | Admitting: Family Medicine

## 2019-06-12 ENCOUNTER — Ambulatory Visit (INDEPENDENT_AMBULATORY_CARE_PROVIDER_SITE_OTHER): Payer: Self-pay | Admitting: Family Medicine

## 2019-06-12 ENCOUNTER — Other Ambulatory Visit: Payer: Self-pay

## 2019-06-12 VITALS — BP 114/78 | HR 66 | Temp 97.8°F | Ht 66.0 in | Wt 222.1 lb

## 2019-06-12 DIAGNOSIS — E669 Obesity, unspecified: Secondary | ICD-10-CM

## 2019-06-12 DIAGNOSIS — E785 Hyperlipidemia, unspecified: Secondary | ICD-10-CM

## 2019-06-12 DIAGNOSIS — R012 Other cardiac sounds: Secondary | ICD-10-CM | POA: Insufficient documentation

## 2019-06-12 DIAGNOSIS — Z0000 Encounter for general adult medical examination without abnormal findings: Secondary | ICD-10-CM

## 2019-06-12 HISTORY — DX: Hyperlipidemia, unspecified: E78.5

## 2019-06-12 LAB — BASIC METABOLIC PANEL
BUN: 19 mg/dL (ref 6–23)
CO2: 30 mEq/L (ref 19–32)
Calcium: 9.5 mg/dL (ref 8.4–10.5)
Chloride: 104 mEq/L (ref 96–112)
Creatinine, Ser: 1.1 mg/dL (ref 0.40–1.50)
GFR: 88.74 mL/min (ref >60.00)
Glucose, Bld: 103 mg/dL — ABNORMAL HIGH (ref 70–99)
Potassium: 3.9 mEq/L (ref 3.5–5.1)
Sodium: 140 mEq/L (ref 135–145)

## 2019-06-12 LAB — LIPID PANEL
Cholesterol: 222 mg/dL — ABNORMAL HIGH (ref 0–200)
HDL: 34.7 mg/dL — ABNORMAL LOW (ref >39.00)
LDL Cholesterol: 160 mg/dL — ABNORMAL HIGH (ref 0–99)
NonHDL: 187.63
Total CHOL/HDL Ratio: 6
Triglycerides: 137 mg/dL (ref 0.0–149.0)
VLDL: 27.4 mg/dL (ref 0.0–40.0)

## 2019-06-12 NOTE — Patient Instructions (Signed)
Labs today You are doing well today Return as needed or in 1-2 years for next physical.   Health Maintenance, Male Adopting a healthy lifestyle and getting preventive care are important in promoting health and wellness. Ask your health care provider about:  The right schedule for you to have regular tests and exams.  Things you can do on your own to prevent diseases and keep yourself healthy. What should I know about diet, weight, and exercise? Eat a healthy diet   Eat a diet that includes plenty of vegetables, fruits, low-fat dairy products, and lean protein.  Do not eat a lot of foods that are high in solid fats, added sugars, or sodium. Maintain a healthy weight Body mass index (BMI) is a measurement that can be used to identify possible weight problems. It estimates body fat based on height and weight. Your health care provider can help determine your BMI and help you achieve or maintain a healthy weight. Get regular exercise Get regular exercise. This is one of the most important things you can do for your health. Most adults should:  Exercise for at least 150 minutes each week. The exercise should increase your heart rate and make you sweat (moderate-intensity exercise).  Do strengthening exercises at least twice a week. This is in addition to the moderate-intensity exercise.  Spend less time sitting. Even light physical activity can be beneficial. Watch cholesterol and blood lipids Have your blood tested for lipids and cholesterol at 42 years of age, then have this test every 5 years. You may need to have your cholesterol levels checked more often if:  Your lipid or cholesterol levels are high.  You are older than 42 years of age.  You are at high risk for heart disease. What should I know about cancer screening? Many types of cancers can be detected early and may often be prevented. Depending on your health history and family history, you may need to have cancer screening  at various ages. This may include screening for:  Colorectal cancer.  Prostate cancer.  Skin cancer.  Lung cancer. What should I know about heart disease, diabetes, and high blood pressure? Blood pressure and heart disease  High blood pressure causes heart disease and increases the risk of stroke. This is more likely to develop in people who have high blood pressure readings, are of African descent, or are overweight.  Talk with your health care provider about your target blood pressure readings.  Have your blood pressure checked: ? Every 3-5 years if you are 53-5 years of age. ? Every year if you are 67 years old or older.  If you are between the ages of 51 and 1 and are a current or former smoker, ask your health care provider if you should have a one-time screening for abdominal aortic aneurysm (AAA). Diabetes Have regular diabetes screenings. This checks your fasting blood sugar level. Have the screening done:  Once every three years after age 15 if you are at a normal weight and have a low risk for diabetes.  More often and at a younger age if you are overweight or have a high risk for diabetes. What should I know about preventing infection? Hepatitis B If you have a higher risk for hepatitis B, you should be screened for this virus. Talk with your health care provider to find out if you are at risk for hepatitis B infection. Hepatitis C Blood testing is recommended for:  Everyone born from [birth year] through 1965.  Anyone with known risk factors for hepatitis C. Sexually transmitted infections (STIs)  You should be screened each year for STIs, including gonorrhea and chlamydia, if: ? You are sexually active and are younger than 42 years of age. ? You are older than 42 years of age and your health care provider tells you that you are at risk for this type of infection. ? Your sexual activity has changed since you were last screened, and you are at increased risk for  chlamydia or gonorrhea. Ask your health care provider if you are at risk.  Ask your health care provider about whether you are at high risk for HIV. Your health care provider may recommend a prescription medicine to help prevent HIV infection. If you choose to take medicine to prevent HIV, you should first get tested for HIV. You should then be tested every 3 months for as long as you are taking the medicine. Follow these instructions at home: Lifestyle  Do not use any products that contain nicotine or tobacco, such as cigarettes, e-cigarettes, and chewing tobacco. If you need help quitting, ask your health care provider.  Do not use street drugs.  Do not share needles.  Ask your health care provider for help if you need support or information about quitting drugs. Alcohol use  Do not drink alcohol if your health care provider tells you not to drink.  If you drink alcohol: ? Limit how much you have to 0-2 drinks a day. ? Be aware of how much alcohol is in your drink. In the [place] one drink equals one 12 oz bottle of beer (355 mL), one 5 oz glass of wine (148 mL), or one 1 oz glass of hard liquor (44 mL). General instructions  Schedule regular health, dental, and eye exams.  Stay current with your vaccines.  Tell your health care provider if: ? You often feel depressed. ? You have ever been abused or do not feel safe at home. Summary  Adopting a healthy lifestyle and getting preventive care are important in promoting health and wellness.  Follow your health care provider's instructions about healthy diet, exercising, and getting tested or screened for diseases.  Follow your health care provider's instructions on monitoring your cholesterol and blood pressure. This information is not intended to replace advice given to you by your health care provider. Make sure you discuss any questions you have with your health care provider. Document Released: 01/28/2008 Document Revised:  07/25/2018 Document Reviewed: 07/25/2018 Elsevier Patient Education  2020 [employer].

## 2019-06-12 NOTE — Assessment & Plan Note (Signed)
Incidentally noted - will continue to monitor. Denies symptoms.

## 2019-06-12 NOTE — Assessment & Plan Note (Signed)
Preventative protocols reviewed and updated unless pt declined. Discussed healthy diet and lifestyle.  

## 2019-06-12 NOTE — Assessment & Plan Note (Signed)
Mild. Update labs.  

## 2019-06-12 NOTE — Assessment & Plan Note (Signed)
Encouraged healthy diet and lifestyle changes to affect sustainable weight loss.  

## 2019-06-12 NOTE — Progress Notes (Signed)
This visit was conducted in person.  BP 114/78 (BP Location: Left Arm, Patient Position: Sitting, Cuff Size: Large)   Pulse 66   Temp 97.8 F (36.6 C) (Temporal)   Ht 5\' 6"  (1.676 m)   Wt 222 lb 2 oz (100.8 kg)   SpO2 97%   BMI 35.85 kg/m    CC: CPE Subjective:    Patient ID: Bradley Guerrero, male    DOB: February 18, 1976, 43 y.o.   MRN: [id number]  HPI: Bradley Guerrero is a 43 y.o. male presenting on 06/12/2019 for Annual Exam   No significant caffeine use.   Preventative: Prostate cancer screening - he asks about prostate cancer screening. No BPH symptoms. No fmhx prostate cancer. Discussed starting age 56yo Flu shot - declines Tdap 2014 Seat belt use discussed  Sunscreen use discussed. No changing moles on skin Non smoker Alcohol - none Dentist last seen a few years ago Eye exam has not seen   Caffeine: 2-3 cups/day Lives with wife and 3 children, no pets Occupation: 2015 in [place]) Edu: HS  Activity: no regular exercise  Diet: water, fruits/vegetables daily      Relevant past medical, surgical, family and social history reviewed and updated as indicated. Interim medical history since our last visit reviewed. Allergies and medications reviewed and updated. Outpatient Medications Prior to Visit  Medication Sig Dispense Refill  . doxycycline (VIBRA-TABS) 100 MG tablet Take 1 tablet (100 mg total) by mouth 2 (two) times daily. 20 tablet 0   No facility-administered medications prior to visit.      Per HPI unless specifically indicated in ROS section below Review of Systems  Constitutional: Negative for activity change, appetite change, chills, fatigue, fever and unexpected weight change.  HENT: Negative for hearing loss.   Eyes: Negative for visual disturbance.  Respiratory: Negative for cough, chest tightness, shortness of breath and wheezing.   Cardiovascular: Negative for chest pain, palpitations and leg swelling.  Gastrointestinal:  Negative for abdominal distention, abdominal pain, blood in stool, constipation, diarrhea, nausea and vomiting.  Genitourinary: Negative for difficulty urinating and hematuria.  Musculoskeletal: Negative for arthralgias, myalgias and neck pain.  Skin: Negative for rash.  Neurological: Negative for dizziness, seizures, syncope and headaches.  Hematological: Negative for adenopathy. Does not bruise/bleed easily.  Psychiatric/Behavioral: Negative for dysphoric mood. The patient is not nervous/anxious.    Objective:    BP 114/78 (BP Location: Left Arm, Patient Position: Sitting, Cuff Size: Large)   Pulse 66   Temp 97.8 F (36.6 C) (Temporal)   Ht 5\' 6"  (1.676 m)   Wt 222 lb 2 oz (100.8 kg)   SpO2 97%   BMI 35.85 kg/m   Wt Readings from Last 3 Encounters:  06/12/19 222 lb 2 oz (100.8 kg)  02/02/18 214 lb 8 oz (97.3 kg)  09/06/17 214 lb (97.1 kg)    Physical Exam Vitals signs and nursing note reviewed.  Constitutional:      General: He is not in acute distress.    Appearance: Normal appearance. He is well-developed. He is not ill-appearing.  HENT:     Head: Normocephalic and atraumatic.     Right Ear: Hearing, tympanic membrane, ear canal and external ear normal.     Left Ear: Hearing, tympanic membrane, ear canal and external ear normal.     Nose: Nose normal.     Mouth/Throat:     Mouth: Mucous membranes are moist.     Pharynx: Oropharynx is clear. Uvula midline. No posterior  oropharyngeal erythema.  Eyes:     General: No scleral icterus.    Conjunctiva/sclera: Conjunctivae normal.     Pupils: Pupils are equal, round, and reactive to light.  Neck:     Musculoskeletal: Normal range of motion and neck supple.  Cardiovascular:     Rate and Rhythm: Normal rate and regular rhythm.     Pulses: Normal pulses.          Radial pulses are 2+ on the right side and 2+ on the left side.     Heart sounds: No murmur.     Comments: Occasional S1 splitting Pulmonary:     Effort:  Pulmonary effort is normal. No respiratory distress.     Breath sounds: Normal breath sounds. No wheezing, rhonchi or rales.  Abdominal:     General: Abdomen is flat. Bowel sounds are normal. There is no distension.     Palpations: Abdomen is soft. There is no mass.     Tenderness: There is no abdominal tenderness. There is no guarding or rebound.     Hernia: No hernia is present.  Musculoskeletal: Normal range of motion.     Right lower leg: No edema.     Left lower leg: No edema.  Lymphadenopathy:     Cervical: No cervical adenopathy.  Skin:    General: Skin is warm and dry.     Findings: No rash.  Neurological:     General: No focal deficit present.     Mental Status: He is alert and oriented to person, place, and time.     Comments: CN grossly intact, station and gait intact  Psychiatric:        Mood and Affect: Mood normal.        Behavior: Behavior normal.        Thought Content: Thought content normal.        Judgment: Judgment normal.       Results for orders placed or performed in visit on 09/06/17  Lipid panel  Result Value Ref Range   Cholesterol 201 (H) 0 - 200 mg/dL   Triglycerides 117.0 0.0 - 149.0 mg/dL   HDL 38.10 (L) >39.00 mg/dL   VLDL 23.4 0.0 - 40.0 mg/dL   LDL Cholesterol 139 (H) 0 - 99 mg/dL   Total CHOL/HDL Ratio 5    NonHDL 162.77   Comprehensive metabolic panel  Result Value Ref Range   Sodium 141 135 - 145 mEq/L   Potassium 3.9 3.5 - 5.1 mEq/L   Chloride 103 96 - 112 mEq/L   CO2 29 19 - 32 mEq/L   Glucose, Bld 95 70 - 99 mg/dL   BUN 21 6 - 23 mg/dL   Creatinine, Ser 1.09 0.40 - 1.50 mg/dL   Total Bilirubin 0.4 0.2 - 1.2 mg/dL   Alkaline Phosphatase 46 39 - 117 U/L   AST 26 0 - 37 U/L   ALT 25 0 - 53 U/L   Total Protein 8.1 6.0 - 8.3 g/dL   Albumin 4.1 3.5 - 5.2 g/dL   Calcium 9.7 8.4 - 10.5 mg/dL   GFR 96.15 >60.00 mL/min  TSH  Result Value Ref Range   TSH 1.09 0.35 - 4.50 uIU/mL   Assessment & Plan:   Problem List Items Addressed  This Visit    Split S1 (first heart sound)    Incidentally noted - will continue to monitor. Denies symptoms.       Obesity, Class II, BMI 35-39.9, isolated (see actual BMI)  Encouraged healthy diet and lifestyle changes to affect sustainable weight loss.       Health maintenance examination - Primary    Preventative protocols reviewed and updated unless pt declined. Discussed healthy diet and lifestyle.       Dyslipidemia    Mild. Update labs.       Relevant Orders   Lipid panel   Basic metabolic panel       No orders of the defined types were placed in this encounter.  Orders Placed This Encounter  Procedures  . Lipid panel  . Basic metabolic panel    Follow up plan: Return in about 1 year (around 06/11/2020) for annual exam, prior fasting for blood work.  Eustaquio BoydenJavier Nattalie Santiesteban, MD

## 2019-06-20 ENCOUNTER — Other Ambulatory Visit: Payer: Self-pay | Admitting: *Deleted

## 2019-06-20 DIAGNOSIS — Z20822 Contact with and (suspected) exposure to covid-19: Secondary | ICD-10-CM

## 2019-06-22 LAB — NOVEL CORONAVIRUS, NAA: SARS-CoV-2, NAA: NOT DETECTED

## 2019-09-16 HISTORY — PX: COLONOSCOPY: SHX174

## 2019-11-23 ENCOUNTER — Ambulatory Visit: Payer: Self-pay | Attending: Internal Medicine

## 2019-11-23 DIAGNOSIS — Z23 Encounter for immunization: Secondary | ICD-10-CM

## 2019-11-23 NOTE — Progress Notes (Signed)
   Covid-19 Vaccination [facility]  Name:  Bradley Guerrero    MRN: [id number] DOB: 01/11/1976  11/23/2019  Mr. Bradley Guerrero was observed post Covid-19 immunization for 15 minutes without incident. He was provided with Vaccine Information Sheet and instruction to access the V-Safe system.   Mr. Bradley Guerrero was instructed to call 911 with any severe reactions post vaccine: Marland Kitchen Difficulty breathing  . Swelling of face and throat  . A fast heartbeat  . A bad rash all over body  . Dizziness and weakness   Immunizations Administered    Name Date Dose VIS Date Route   Pfizer COVID-19 Vaccine 11/23/2019  4:00 PM 0.3 mL 07/26/2019 Intramuscular   Manufacturer: [employer], [employer]   Lot: [id number]   NDC: [address]-4

## 2019-12-16 ENCOUNTER — Ambulatory Visit: Payer: Self-pay | Attending: Internal Medicine

## 2019-12-16 DIAGNOSIS — Z23 Encounter for immunization: Secondary | ICD-10-CM

## 2019-12-16 NOTE — Progress Notes (Signed)
   Covid-19 Vaccination Clinic  Name:  Bradley Guerrero    MRN: [id number] DOB: Jun 18, 1976  12/16/2019  [name] was observed post Covid-19 immunization for 15 minutes without incident. He was provided with Vaccine Information Sheet and instruction to access the V-Safe system.   [name] was instructed to call 911 with any severe reactions post vaccine: Marland Kitchen Difficulty breathing  . Swelling of face and throat  . A fast heartbeat  . A bad rash all over body  . Dizziness and weakness   Immunizations Administered    Name Date Dose VIS Date Route   Pfizer COVID-19 Vaccine 12/16/2019  2:41 PM 0.3 mL 10/09/2018 Intramuscular   Manufacturer: [employer], [employer]   Lot: [id number]   NDC: [address]-0

## 2020-09-02 ENCOUNTER — Other Ambulatory Visit: Payer: Self-pay

## 2020-09-02 DIAGNOSIS — Z20822 Contact with and (suspected) exposure to covid-19: Secondary | ICD-10-CM

## 2020-09-04 LAB — NOVEL CORONAVIRUS, NAA: SARS-CoV-2, NAA: DETECTED — AB

## 2020-09-04 LAB — SARS-COV-2, NAA 2 DAY TAT

## 2020-09-10 ENCOUNTER — Telehealth: Payer: Self-pay

## 2020-09-10 NOTE — Telephone Encounter (Signed)
Pt lvm returning my call about sxs (see Lab Resut Notes, 09/02/20).  Pt states he is feeling much better and is no longer having sxs.    Lvm asking pt to call back.  Need to know start date of sxs.

## 2020-09-11 NOTE — Telephone Encounter (Signed)
Pt lvm returning my call about sxs (see Lab Resut Notes, 09/02/20).  Pt states he is feeling much better and is no longer having sxs.    Lvm asking pt to call back.  Need to know start date of sxs.  

## 2020-09-14 NOTE — Telephone Encounter (Signed)
Pt lvm returning my call about sxs (see Lab Resut Notes, 09/02/20).  Pt states he is feeling much better and is no longer having sxs.    Lvm asking pt to call back.  Need to know start date of sxs.  Mailing a letter.

## 2021-09-21 ENCOUNTER — Ambulatory Visit: Payer: Self-pay | Admitting: Family Medicine

## 2022-03-07 ENCOUNTER — Telehealth: Payer: Self-pay | Admitting: Family Medicine

## 2022-03-07 NOTE — Telephone Encounter (Signed)
Calling to make a physical appointment for her husband, last seen October 2020  Is this okay?

## 2022-03-07 NOTE — Telephone Encounter (Signed)
Yes, plz schedule.

## 2022-06-13 ENCOUNTER — Encounter: Payer: Self-pay | Admitting: Family Medicine

## 2022-06-13 ENCOUNTER — Ambulatory Visit (INDEPENDENT_AMBULATORY_CARE_PROVIDER_SITE_OTHER): Payer: BC Managed Care – PPO | Admitting: Family Medicine

## 2022-06-13 VITALS — BP 124/78 | HR 75 | Temp 97.8°F | Ht 66.0 in | Wt 236.0 lb

## 2022-06-13 DIAGNOSIS — Z0000 Encounter for general adult medical examination without abnormal findings: Secondary | ICD-10-CM

## 2022-06-13 DIAGNOSIS — Z125 Encounter for screening for malignant neoplasm of prostate: Secondary | ICD-10-CM

## 2022-06-13 DIAGNOSIS — E785 Hyperlipidemia, unspecified: Secondary | ICD-10-CM | POA: Diagnosis not present

## 2022-06-13 DIAGNOSIS — E669 Obesity, unspecified: Secondary | ICD-10-CM

## 2022-06-13 MED ORDER — MULTIVITAMIN ADULT PO TABS: 1.0000 | ORAL_TABLET | Freq: Every day | ORAL | Status: DC

## 2022-06-13 NOTE — Progress Notes (Addendum)
Patient ID: Bradley Guerrero, male    DOB: May 05, 1976, 46 y.o.   MRN: [id number]  This visit was conducted in person.  BP 124/78   Pulse 75   Temp 97.8 F (36.6 C) (Temporal)   Ht 5\' 6"  (1.676 m)   Wt 236 lb (107 kg)   SpO2 97%   BMI 38.09 kg/m   Vision Screening   Right eye Left eye Both eyes  Without correction 20/25 20/20 20/20   With correction        CC: CPE Subjective:   HPI: Bradley Guerrero is a 46 y.o. male presenting on 06/13/2022 for Annual Exam   Preventative: Colonoscopy recently WNL per pt (Dr Benson [place]) - records requested Prostate cancer screening - No BPH symptoms. No fmhx prostate cancer. Discussed pros/cons of prostate screening. Will start with PSA.  Flu shot - declines  COVID vaccine - Pfizer 11/2019, 12/2019  Tdap 2014 Seat belt use discussed  Sunscreen use discussed. 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: good water, fruits/vegetables daily      Relevant past medical, surgical, family and social history reviewed and updated as indicated. Interim medical history since our last visit reviewed. Allergies and medications reviewed and updated. No outpatient medications prior to visit.   No facility-administered medications prior to visit.     Per HPI unless specifically indicated in ROS section below Review of Systems  Constitutional:  Negative for activity change, appetite change, chills, fatigue, fever and unexpected weight change.  HENT:  Negative for hearing loss.   Eyes:  Negative for visual disturbance.  Respiratory:  Negative for cough, chest tightness, shortness of breath and wheezing.   Cardiovascular:  Negative for chest pain, palpitations and leg swelling.  Gastrointestinal:  Negative for  abdominal distention, abdominal pain, blood in stool, constipation, diarrhea, nausea and vomiting.  Genitourinary:  Negative for difficulty urinating and hematuria.  Musculoskeletal:  Negative for arthralgias, myalgias and neck pain.  Skin:  Negative for rash.  Neurological:  Negative for dizziness, seizures, syncope and headaches.  Hematological:  Negative for adenopathy. Does not bruise/bleed easily.  Psychiatric/Behavioral:  Negative for dysphoric mood. The patient is not nervous/anxious.     Objective:  BP 124/78   Pulse 75   Temp 97.8 F (36.6 C) (Temporal)   Ht 5\' 6"  (1.676 m)   Wt 236 lb (107 kg)   SpO2 97%   BMI 38.09 kg/m   Wt Readings from Last 3 Encounters:  06/13/22 236 lb (107 kg)  06/12/19 222 lb 2 oz (100.8 kg)  02/02/18 214 lb 8 oz (97.3 kg)      Physical Exam Vitals and nursing note reviewed.  Constitutional:      General: He is not in acute distress.    Appearance: Normal appearance. He is well-developed. He is not ill-appearing.  HENT:     Head: Normocephalic and atraumatic.     Right Ear: Hearing, tympanic membrane, ear canal and external ear normal.     Left Ear: Hearing, tympanic membrane, ear canal and external ear normal.  Eyes:     General: No scleral icterus.    Extraocular Movements: Extraocular movements intact.     Conjunctiva/sclera: Conjunctivae normal.     Pupils: Pupils are  equal, round, and reactive to light.  Neck:     Thyroid: No thyroid mass or thyromegaly.  Cardiovascular:     Rate and Rhythm: Normal rate and regular rhythm.     Pulses: Normal pulses.          Radial pulses are 2+ on the right side and 2+ on the left side.     Heart sounds: Normal heart sounds. No murmur heard. Pulmonary:     Effort: Pulmonary effort is normal. No respiratory distress.     Breath sounds: Normal breath sounds. No wheezing, rhonchi or rales.  Abdominal:     General: Bowel sounds are normal. There is no distension.     Palpations: Abdomen is soft.  There is no mass.     Tenderness: There is no abdominal tenderness. There is no guarding or rebound.     Hernia: No hernia is present.  Musculoskeletal:        General: Normal range of motion.     Cervical back: Normal range of motion and neck supple.     Right lower leg: No edema.     Left lower leg: No edema.  Lymphadenopathy:     Cervical: No cervical adenopathy.  Skin:    General: Skin is warm and dry.     Findings: No rash.  Neurological:     General: No focal deficit present.     Mental Status: He is alert and oriented to person, place, and time.  Psychiatric:        Mood and Affect: Mood normal.        Behavior: Behavior normal.        Thought Content: Thought content normal.        Judgment: Judgment normal.       Lab Results  Component Value Date   CREATININE 1.10 06/12/2019   BUN 19 06/12/2019   NA 140 06/12/2019   K 3.9 06/12/2019   CL 104 06/12/2019   CO2 30 06/12/2019    Lab Results  Component Value Date   CHOL 222 (H) 06/12/2019   HDL 34.70 (L) 06/12/2019   LDLCALC 160 (H) 06/12/2019   TRIG 137.0 06/12/2019   CHOLHDL 6 06/12/2019     Assessment & Plan:   Problem List Items Addressed This Visit     Health maintenance examination - Primary (Chronic)    Preventative protocols reviewed and updated unless pt declined. Discussed healthy diet and lifestyle.       Obesity, Class II, BMI 35-39.9, isolated (see actual BMI)    Encouraged healthy diet and lifestyle choices to affect sustainable weight loss.       Dyslipidemia    Chronic, off medication. Update FLP.       Relevant Orders   Lipid panel   Comprehensive metabolic panel   Other Visit Diagnoses     Special screening for malignant neoplasm of prostate       Relevant Orders   PSA        Meds ordered this encounter  Medications   Multiple Vitamin (MULTIVITAMIN ADULT) TABS    Sig: Take 1 tablet by mouth daily.   Orders Placed This Encounter  Procedures   Lipid panel    Standing  Status:   Future    Standing Expiration Date:   06/14/2023   Comprehensive metabolic panel    Standing Status:   Future    Standing Expiration Date:   06/14/2023   PSA    Standing Status:  Future    Standing Expiration Date:   06/14/2023     Patient instructions: Return at your convenience for fasting labs one afternoon (4 hour fast).  We will request colonoscopy from Dr [facility] office in [place].  Eye exam today.  Work on incorporating regular exercise into routine. Goal 150 min moderate intensity aerobic exercise weekly.  Return as needed or in 1 year for next physical.   Follow up plan: Return in about 1 year (around 06/14/2023) for annual exam, prior fasting for blood work.  Eustaquio Boyden, MD

## 2022-06-13 NOTE — Assessment & Plan Note (Signed)
Preventative protocols reviewed and updated unless pt declined. Discussed healthy diet and lifestyle.  

## 2022-06-13 NOTE — Patient Instructions (Addendum)
Return at your convenience for fasting labs one afternoon (4 hour fast).  We will request colonoscopy from Dr [facility] office in [place].  Eye exam today.  Work on incorporating regular exercise into routine. Goal 150 min moderate intensity aerobic exercise weekly.  Return as needed or in 1 year for next physical.   Health Maintenance, Male Adopting a healthy lifestyle and getting preventive care are important in promoting health and wellness. Ask your health care provider about: The right schedule for you to have regular tests and exams. Things you can do on your own to prevent diseases and keep yourself healthy. What should I know about diet, weight, and exercise? Eat a healthy diet  Eat a diet that includes plenty of vegetables, fruits, low-fat dairy products, and lean protein. Do not eat a lot of foods that are high in solid fats, added sugars, or sodium. Maintain a healthy weight Body mass index (BMI) is a measurement that can be used to identify possible weight problems. It estimates body fat based on height and weight. Your health care provider can help determine your BMI and help you achieve or maintain a healthy weight. Get regular exercise Get regular exercise. This is one of the most important things you can do for your health. Most adults should: Exercise for at least 150 minutes each week. The exercise should increase your heart rate and make you sweat (moderate-intensity exercise). Do strengthening exercises at least twice a week. This is in addition to the moderate-intensity exercise. Spend less time sitting. Even light physical activity can be beneficial. Watch cholesterol and blood lipids Have your blood tested for lipids and cholesterol at 45 years of age, then have this test every 5 years. You may need to have your cholesterol levels checked more often if: Your lipid or cholesterol levels are high. You are older than 45 years of age. You are at high risk for heart  disease. What should I know about cancer screening? Many types of cancers can be detected early and may often be prevented. Depending on your health history and family history, you may need to have cancer screening at various ages. This may include screening for: Colorectal cancer. Prostate cancer. Skin cancer. Lung cancer. What should I know about heart disease, diabetes, and high blood pressure? Blood pressure and heart disease High blood pressure causes heart disease and increases the risk of stroke. This is more likely to develop in people who have high blood pressure readings or are overweight. Talk with your health care provider about your target blood pressure readings. Have your blood pressure checked: Every 3-5 years if you are 59-55 years of age. Every year if you are 27 years old or older. If you are between the ages of 72 and 28 and are a current or former smoker, ask your health care provider if you should have a one-time screening for abdominal aortic aneurysm (AAA). Diabetes Have regular diabetes screenings. This checks your fasting blood sugar level. Have the screening done: Once every three years after age 33 if you are at a normal weight and have a low risk for diabetes. More often and at a younger age if you are overweight or have a high risk for diabetes. What should I know about preventing infection? Hepatitis B If you have a higher risk for hepatitis B, you should be screened for this virus. Talk with your health care provider to find out if you are at risk for hepatitis B infection. Hepatitis C Blood testing  is recommended for: Everyone born from [birth year] through 1964. Anyone with known risk factors for hepatitis C. Sexually transmitted infections (STIs) You should be screened each year for STIs, including gonorrhea and chlamydia, if: You are sexually active and are younger than 45 years of age. You are older than 45 years of age and your health care provider tells you  that you are at risk for this type of infection. Your sexual activity has changed since you were last screened, and you are at increased risk for chlamydia or gonorrhea. Ask your health care provider if you are at risk. Ask your health care provider about whether you are at high risk for HIV. Your health care provider may recommend a prescription medicine to help prevent HIV infection. If you choose to take medicine to prevent HIV, you should first get tested for HIV. You should then be tested every 3 months for as long as you are taking the medicine. Follow these instructions at home: Alcohol use Do not drink alcohol if your health care provider tells you not to drink. If you drink alcohol: Limit how much you have to 0-2 drinks a day. Know how much alcohol is in your drink. In the [place] one drink equals one 12 oz bottle of beer (355 mL), one 5 oz glass of wine (148 mL), or one 1 oz glass of hard liquor (44 mL). Lifestyle Do not use any products that contain nicotine or tobacco. These products include cigarettes, chewing tobacco, and vaping devices, such as e-cigarettes. If you need help quitting, ask your health care provider. Do not use street drugs. Do not share needles. Ask your health care provider for help if you need support or information about quitting drugs. General instructions Schedule regular health, dental, and eye exams. Stay current with your vaccines. Tell your health care provider if: You often feel depressed. You have ever been abused or do not feel safe at home. Summary Adopting a healthy lifestyle and getting preventive care are important in promoting health and wellness. Follow your health care provider's instructions about healthy diet, exercising, and getting tested or screened for diseases. Follow your health care provider's instructions on monitoring your cholesterol and blood pressure. This information is not intended to replace advice given to you by your health  care provider. Make sure you discuss any questions you have with your health care provider. Document Revised: 12/21/2020 Document Reviewed: 12/21/2020 Elsevier Patient Education  [place].

## 2022-06-13 NOTE — Assessment & Plan Note (Signed)
Encouraged healthy diet and lifestyle choices to affect sustainable weight loss.  ?

## 2022-06-13 NOTE — Assessment & Plan Note (Addendum)
Chronic, off medication. Update FLP when he returns fasting.

## 2022-06-14 ENCOUNTER — Encounter: Payer: Self-pay | Admitting: Family Medicine

## 2022-06-16 ENCOUNTER — Other Ambulatory Visit (INDEPENDENT_AMBULATORY_CARE_PROVIDER_SITE_OTHER): Payer: BC Managed Care – PPO

## 2022-06-16 DIAGNOSIS — Z125 Encounter for screening for malignant neoplasm of prostate: Secondary | ICD-10-CM

## 2022-06-16 DIAGNOSIS — E785 Hyperlipidemia, unspecified: Secondary | ICD-10-CM | POA: Diagnosis not present

## 2022-06-17 ENCOUNTER — Other Ambulatory Visit: Payer: BC Managed Care – PPO

## 2022-06-17 LAB — COMPREHENSIVE METABOLIC PANEL
ALT: 28 U/L (ref 0–53)
AST: 39 U/L — ABNORMAL HIGH (ref 0–37)
Albumin: 4.1 g/dL (ref 3.5–5.2)
Alkaline Phosphatase: 52 U/L (ref 39–117)
BUN: 24 mg/dL — ABNORMAL HIGH (ref 6–23)
CO2: 29 mEq/L (ref 19–32)
Calcium: 9.4 mg/dL (ref 8.4–10.5)
Chloride: 102 mEq/L (ref 96–112)
Creatinine, Ser: 1.06 mg/dL (ref 0.40–1.50)
GFR: 84.94 mL/min (ref >60.00)
Glucose, Bld: 76 mg/dL (ref 70–99)
Potassium: 4.4 mEq/L (ref 3.5–5.1)
Sodium: 140 mEq/L (ref 135–145)
Total Bilirubin: 0.4 mg/dL (ref 0.2–1.2)
Total Protein: 7.8 g/dL (ref 6.0–8.3)

## 2022-06-17 LAB — LIPID PANEL
Cholesterol: 225 mg/dL — ABNORMAL HIGH (ref 0–200)
HDL: 38.5 mg/dL — ABNORMAL LOW (ref >39.00)
LDL Cholesterol: 165 mg/dL — ABNORMAL HIGH (ref 0–99)
NonHDL: 186.17
Total CHOL/HDL Ratio: 6
Triglycerides: 106 mg/dL (ref 0.0–149.0)
VLDL: 21.2 mg/dL (ref 0.0–40.0)

## 2022-06-17 LAB — PSA: PSA: 0.82 ng/mL (ref 0.10–4.00)

## 2023-04-04 NOTE — Progress Notes (Deleted)
Bradley Guerrero [name], MD, CAQ Sports Medicine [facility] [address] [place], [address]  Phone: [phone]  FAX: [phone]  Bradley Guerrero - 47 y.o. male  MRN [id number]  Date of Birth: [birth year]  Date: 04/05/2023  PCP: Bradley Boyden, MD  Referral: Bradley Boyden, MD  No chief complaint on file.  Subjective:   Bradley Guerrero is a 47 y.o. very pleasant male patient with There is no height or weight on file to calculate BMI. who presents with the following:  The patient presents with some ongoing low back pain.  I did see him many years ago for some ankle pain.    Review of Systems is noted in the HPI, as appropriate  Objective:   There were no vitals taken for this visit.  GEN: No acute distress; alert,appropriate. PULM: Breathing comfortably in no respiratory distress PSYCH: Normally interactive.   Laboratory and Imaging Data:  Assessment and Plan:   ***

## 2023-04-05 ENCOUNTER — Ambulatory Visit: Payer: BC Managed Care – PPO | Admitting: Family Medicine

## 2023-04-05 ENCOUNTER — Encounter: Payer: Self-pay | Admitting: Family Medicine

## 2023-04-05 VITALS — BP 110/82 | HR 74 | Temp 98.2°F | Ht 66.0 in | Wt 235.5 lb

## 2023-04-05 DIAGNOSIS — M545 Low back pain, unspecified: Secondary | ICD-10-CM | POA: Diagnosis not present

## 2023-04-05 MED ORDER — PREDNISONE 20 MG PO TABS: ORAL_TABLET | ORAL | 0 refills | Status: DC

## 2023-04-05 MED ORDER — TIZANIDINE HCL 4 MG PO TABS: 4.0000 mg | ORAL_TABLET | Freq: Every evening | ORAL | 1 refills | Status: DC | PRN

## 2023-04-05 NOTE — Progress Notes (Signed)
Bradley Guerrero T. [name], MD, CAQ Sports Medicine [facility] [address] [name], [address]  Phone: [phone]  FAX: [phone]  [place] - 47 y.o. male  MRN [id number]  Date of Birth: 1976/02/23  Date: 04/05/2023  PCP: Eustaquio Boyden, MD  Referral: Eustaquio Boyden, MD  Chief Complaint  Patient presents with   Back Pain    Lower back x 2 to 3 weeks   Subjective:   Bradley Guerrero is a 47 y.o. very pleasant male patient who presents with the following: Back Pain  ongoing for approximately: 2-3 weeks The patient has had back pain before. The back pain is localized into the lumbar spine area. They also describe no radiculopathy.  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 No bowel or bladder incontinence. No focal weakness. Prior interventions: none Physical therapy: No Chiropractic manipulations: y Acupuncture: No Osteopathic manipulation: No Heat or cold: Minimal effect   Review of Systems is noted in the HPI, as appropriate  Objective:   Blood pressure 110/82, pulse 74, temperature 98.2 F (36.8 C), temperature source Temporal, height 5\' 6"  (1.676 m), weight 235 lb 8 oz (106.8 kg), SpO2 98%.  GEN: No acute distress; alert,appropriate. PULM: Breathing comfortably in no respiratory distress PSYCH: Normally interactive.   Range of motion at  the waist: Flexion, rotation and lateral bending: Forward flexion, extension, lateral bending, and rotation maneuvers are preserved  No echymosis or edema Rises to examination table with no difficulty Gait: minimally antalgic  Inspection/Deformity: No abnormality Paraspinus T: L5-S1 tenderness bilateral  B Ankle Dorsiflexion (L5,4): 5/5 B Great Toe  Dorsiflexion (L5,4): 5/5 Heel Walk (L5): WNL Toe Walk (S1): WNL Rise/Squat (L4): WNL, mild pain  SENSORY B Medial Foot (L4): WNL B Dorsum (L5): WNL B Lateral (S1): WNL Light Touch: WNL Pinprick: WNL  REFLEXES Knee (L4): 2+ Ankle (S1): 2+  B SLR, seated: neg B SLR, supine: neg B FABER: neg B Reverse FABER: Mild pain bilateral B Greater Troch: NT B Log Roll: neg B Stork: NT B Sciatic Notch: Tender to palpation bilateral  Radiology: No results found.  Assessment and Plan:     ICD-10-CM   1. Acute bilateral low back pain without sciatica  M54.50       Anatomy reviewed. Conservative algorithms for acute back pain generally begin with the following: NSAIDS, Muscle Relaxants, Mild pain medication -failure ibuprofen, prednisone.  Start with medications, core rehab, and progress from there following low back pain algorithm. No red flags are present.  Home exercise program given.  Follow-up: Follow-up prn  Meds ordered this encounter  Medications   predniSONE (DELTASONE) 20 MG tablet    Sig: 2 tabs po daily for 5 days, then 1 tab po daily for 5 days    Dispense:  15 tablet    Refill:  0   tiZANidine (ZANAFLEX) 4 MG tablet    Sig: Take 1 tablet (4 mg total) by mouth at bedtime as needed for muscle spasms.    Dispense:  30 tablet    Refill:  1   There are no discontinued medications. No orders of the defined types were placed in this encounter.   Signed,  Elpidio Galea. Ronrico Dupin, MD  Outpatient Encounter Medications as of 04/05/2023  Medication Sig   Multiple Vitamin (MULTIVITAMIN ADULT) TABS Take 1 tablet by mouth daily.   predniSONE (DELTASONE) 20 MG tablet 2 tabs po daily for 5 days, then 1 tab po daily for 5 days   tiZANidine (ZANAFLEX) 4 MG tablet Take 1 tablet (4 mg total) by mouth at bedtime as needed for muscle spasms.   No facility-administered encounter medications on file as of 04/05/2023.

## 2023-06-09 ENCOUNTER — Other Ambulatory Visit: Payer: BC Managed Care – PPO

## 2023-06-16 ENCOUNTER — Encounter: Payer: BC Managed Care – PPO | Admitting: Family Medicine

## 2023-07-22 ENCOUNTER — Encounter: Payer: Self-pay | Admitting: Family Medicine

## 2023-07-22 ENCOUNTER — Other Ambulatory Visit: Payer: Self-pay | Admitting: Family Medicine

## 2023-07-22 DIAGNOSIS — E785 Hyperlipidemia, unspecified: Secondary | ICD-10-CM

## 2023-07-22 DIAGNOSIS — Z1159 Encounter for screening for other viral diseases: Secondary | ICD-10-CM

## 2023-07-31 ENCOUNTER — Other Ambulatory Visit: Payer: BC Managed Care – PPO

## 2023-08-07 ENCOUNTER — Encounter: Payer: BC Managed Care – PPO | Admitting: Family Medicine

## 2023-08-10 ENCOUNTER — Encounter: Payer: Self-pay | Admitting: Family Medicine

## 2023-10-05 ENCOUNTER — Other Ambulatory Visit (INDEPENDENT_AMBULATORY_CARE_PROVIDER_SITE_OTHER): Payer: BC Managed Care – PPO

## 2023-10-05 ENCOUNTER — Other Ambulatory Visit: Payer: BC Managed Care – PPO

## 2023-10-05 DIAGNOSIS — E785 Hyperlipidemia, unspecified: Secondary | ICD-10-CM | POA: Diagnosis not present

## 2023-10-05 DIAGNOSIS — Z1159 Encounter for screening for other viral diseases: Secondary | ICD-10-CM

## 2023-10-06 LAB — COMPREHENSIVE METABOLIC PANEL
ALT: 22 U/L (ref 0–53)
AST: 22 U/L (ref 0–37)
Albumin: 4 g/dL (ref 3.5–5.2)
Alkaline Phosphatase: 55 U/L (ref 39–117)
BUN: 16 mg/dL (ref 6–23)
CO2: 32 meq/L (ref 19–32)
Calcium: 9.3 mg/dL (ref 8.4–10.5)
Chloride: 103 meq/L (ref 96–112)
Creatinine, Ser: 1.19 mg/dL (ref 0.40–1.50)
GFR: 73.25 mL/min (ref >60.00)
Glucose, Bld: 57 mg/dL — ABNORMAL LOW (ref 70–99)
Potassium: 4 meq/L (ref 3.5–5.1)
Sodium: 142 meq/L (ref 135–145)
Total Bilirubin: 0.3 mg/dL (ref 0.2–1.2)
Total Protein: 7.7 g/dL (ref 6.0–8.3)

## 2023-10-06 LAB — LIPID PANEL
Cholesterol: 227 mg/dL — ABNORMAL HIGH (ref 0–200)
HDL: 37.8 mg/dL — ABNORMAL LOW (ref >39.00)
LDL Cholesterol: 139 mg/dL — ABNORMAL HIGH (ref 0–99)
NonHDL: 189.48
Total CHOL/HDL Ratio: 6
Triglycerides: 251 mg/dL — ABNORMAL HIGH (ref 0.0–149.0)
VLDL: 50.2 mg/dL — ABNORMAL HIGH (ref 0.0–40.0)

## 2023-10-06 LAB — HEPATITIS C ANTIBODY: Hepatitis C Ab: NONREACTIVE

## 2023-10-06 LAB — TSH: TSH: 0.76 u[IU]/mL (ref 0.35–5.50)

## 2023-10-10 ENCOUNTER — Ambulatory Visit (INDEPENDENT_AMBULATORY_CARE_PROVIDER_SITE_OTHER): Payer: BC Managed Care – PPO | Admitting: Family Medicine

## 2023-10-10 ENCOUNTER — Encounter: Payer: Self-pay | Admitting: Family Medicine

## 2023-10-10 VITALS — BP 126/70 | HR 94 | Temp 97.8°F | Ht 65.5 in | Wt 245.1 lb

## 2023-10-10 DIAGNOSIS — Z0000 Encounter for general adult medical examination without abnormal findings: Secondary | ICD-10-CM | POA: Diagnosis not present

## 2023-10-10 DIAGNOSIS — Z23 Encounter for immunization: Secondary | ICD-10-CM

## 2023-10-10 DIAGNOSIS — E785 Hyperlipidemia, unspecified: Secondary | ICD-10-CM | POA: Diagnosis not present

## 2023-10-10 NOTE — Assessment & Plan Note (Signed)
 Chronic, recent cholesterol levels were not fasting - this may be why triglycerides were elevated. Reviewed diet choices to improve LDL and trig.  The 10-year ASCVD risk score (Arnett DK, et al., 2019) is: 4.8%   Values used to calculate the score:     Age: 48 years     Sex: Male     Is Non-Hispanic African American: Yes     Diabetic: No     Tobacco smoker: No     Systolic Blood Pressure: 126 mmHg     Is BP treated: No     HDL Cholesterol: 37.8 mg/dL     Total Cholesterol: 227 mg/dL

## 2023-10-10 NOTE — Assessment & Plan Note (Signed)
 Preventative protocols reviewed and updated unless pt declined. Discussed healthy diet and lifestyle.

## 2023-10-10 NOTE — Progress Notes (Signed)
 Ph: [phone] Fax: [phone]   Patient ID: Bradley Guerrero, male    DOB: [birth year], 48 y.o.   MRN: [id number]  This visit was conducted in person.  BP 126/70   Pulse 94   Temp 97.8 F (36.6 C) (Oral)   Ht 5' 5.5" (1.664 m)   Wt 245 lb 2 oz (111.2 kg)   SpO2 98%   BMI 40.17 kg/m    CC: CPE Subjective:   HPI: Bradley Guerrero is a 48 y.o. male presenting on 10/10/2023 for Annual Exam   Preventative: COLONOSCOPY 09/2019 - WNL, medium sized lipoma, rpt 10 yrs Bradley Guerrero) Prostate cancer screening - No BPH symptoms. No fmhx prostate cancer. Discussed pros/cons of prostate screening. Will start with PSA.  Flu shot - declines  COVID vaccine - Pfizer 11/2019, 12/2019  Tdap 2014, rpt today  Seat belt use discussed  Sunscreen use discussed. No changing moles on skin Sleep - averaging 5-6 hours/night Non smoker Alcohol - none Dentist yearly Eye exam - has not seen. No fmhx glaucoma.   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 Interim medical history since our last visit reviewed. Allergies and medications reviewed and updated. Outpatient Medications Prior to Visit  Medication Sig Dispense Refill   Multiple Vitamin (MULTIVITAMIN ADULT) TABS Take 1 tablet by mouth daily.     predniSONE (DELTASONE) 20 MG tablet 2 tabs po daily for 5 days, then 1 tab po daily for 5 days 15 tablet 0   tiZANidine (ZANAFLEX) 4 MG tablet Take 1 tablet (4 mg total) by mouth at bedtime as needed for muscle spasms. 30 tablet 1   No facility-administered medications prior to visit.     Per HPI unless specifically indicated in ROS section below Review of Systems  Constitutional:  Negative for activity change, appetite  change, chills, fatigue, fever and unexpected weight change.  HENT:  Negative for hearing loss.   Eyes:  Negative for visual disturbance.  Respiratory:  Negative for cough, chest tightness, shortness of breath and wheezing.   Cardiovascular:  Negative for chest pain, palpitations and leg swelling.  Gastrointestinal:  Negative for abdominal distention, abdominal pain, blood in stool, constipation, diarrhea, nausea and vomiting.  Genitourinary:  Negative for difficulty urinating and hematuria.  Musculoskeletal:  Negative for arthralgias, myalgias and neck pain.  Skin:  Negative for rash.  Neurological:  Negative for dizziness, seizures, syncope and headaches.  Hematological:  Negative for adenopathy. Does not bruise/bleed easily.  Psychiatric/Behavioral:  Negative for dysphoric mood. The patient is not nervous/anxious.     Objective:  BP 126/70   Pulse 94   Temp 97.8 F (36.6 C) (Oral)   Ht 5' 5.5" (1.664 m)   Wt 245 lb 2 oz (111.2 kg)   SpO2 98%   BMI 40.17 kg/m   Wt Readings from Last 3 Encounters:  10/10/23 245 lb 2 oz (111.2 kg)  04/05/23 235 lb 8 oz (106.8 kg)  06/13/22 236 lb (107 kg)      Physical Exam Vitals and nursing note reviewed.  Constitutional:      General: He is not in acute distress.    Appearance: Normal appearance. He is well-developed. He is not ill-appearing.  HENT:     Head: Normocephalic  and atraumatic.     Right Ear: Hearing, tympanic membrane, ear canal and external ear normal.     Left Ear: Hearing, tympanic membrane, ear canal and external ear normal.     Mouth/Throat:     Mouth: Mucous membranes are moist.     Pharynx: Oropharynx is clear. No oropharyngeal exudate or posterior oropharyngeal erythema.  Eyes:     General: No scleral icterus.    Extraocular Movements: Extraocular movements intact.     Conjunctiva/sclera: Conjunctivae normal.     Pupils: Pupils are equal, round, and reactive to light.  Neck:     Thyroid: No thyroid mass or  thyromegaly.  Cardiovascular:     Rate and Rhythm: Normal rate and regular rhythm.     Pulses: Normal pulses.          Radial pulses are 2+ on the right side and 2+ on the left side.     Heart sounds: Normal heart sounds. No murmur heard. Pulmonary:     Effort: Pulmonary effort is normal. No respiratory distress.     Breath sounds: Normal breath sounds. No wheezing, rhonchi or rales.  Abdominal:     General: Bowel sounds are normal. There is no distension.     Palpations: Abdomen is soft. There is no mass.     Tenderness: There is no abdominal tenderness. There is no guarding or rebound.     Hernia: No hernia is present.  Musculoskeletal:        General: Normal range of motion.     Cervical back: Normal range of motion and neck supple.     Right lower leg: No edema.     Left lower leg: No edema.  Lymphadenopathy:     Cervical: No cervical adenopathy.  Skin:    General: Skin is warm and dry.     Findings: No rash.  Neurological:     General: No focal deficit present.     Mental Status: He is alert and oriented to person, place, and time.  Psychiatric:        Mood and Affect: Mood normal.        Behavior: Behavior normal.        Thought Content: Thought content normal.        Judgment: Judgment normal.       Results for orders placed or performed in visit on 10/05/23  Hepatitis C antibody   Collection Time: 10/05/23  3:29 PM  Result Value Ref Range   Hepatitis C Ab NON-REACTIVE NON-REACTIVE  TSH   Collection Time: 10/05/23  3:29 PM  Result Value Ref Range   TSH 0.76 0.35 - 5.50 uIU/mL  Comprehensive metabolic panel   Collection Time: 10/05/23  3:29 PM  Result Value Ref Range   Sodium 142 135 - 145 mEq/L   Potassium 4.0 3.5 - 5.1 mEq/L   Chloride 103 96 - 112 mEq/L   CO2 32 19 - 32 mEq/L   Glucose, Bld 57 (L) 70 - 99 mg/dL   BUN 16 6 - 23 mg/dL   Creatinine, Ser 1.61 0.40 - 1.50 mg/dL   Total Bilirubin 0.3 0.2 - 1.2 mg/dL   Alkaline Phosphatase 55 39 - 117 U/L    AST 22 0 - 37 U/L   ALT 22 0 - 53 U/L   Total Protein 7.7 6.0 - 8.3 g/dL   Albumin 4.0 3.5 - 5.2 g/dL   GFR 09.60 >45.40 mL/min   Calcium 9.3 8.4 - 10.5 mg/dL  Lipid  panel   Collection Time: 10/05/23  3:29 PM  Result Value Ref Range   Cholesterol 227 (H) 0 - 200 mg/dL   Triglycerides 191.4 (H) 0.0 - 149.0 mg/dL   HDL 78.29 (L) >56.21 mg/dL   VLDL 30.8 (H) 0.0 - 65.7 mg/dL   LDL Cholesterol 846 (H) 0 - 99 mg/dL   Total CHOL/HDL Ratio 6    NonHDL 189.48     Assessment & Plan:   Problem List Items Addressed This Visit     Health maintenance examination - Primary (Chronic)   Preventative protocols reviewed and updated unless pt declined. Discussed healthy diet and lifestyle.       Obesity, Class III, BMI 40-49.9 (morbid obesity) (HCC)   Weight gain noted. Encouraged healthy diet and lifestyle choices to affect sustainable weight loss.       Dyslipidemia   Chronic, recent cholesterol levels were not fasting - this may be why triglycerides were elevated. Reviewed diet choices to improve LDL and trig.  The 10-year ASCVD risk score (Arnett DK, et al., 2019) is: 4.8%   Values used to calculate the score:     Age: 25 years     Sex: Male     Is Non-Hispanic African American: Yes     Diabetic: No     Tobacco smoker: No     Systolic Blood Pressure: 126 mmHg     Is BP treated: No     HDL Cholesterol: 37.8 mg/dL     Total Cholesterol: 227 mg/dL       Other Visit Diagnoses       Need for Tdap vaccination       Relevant Orders   Tdap vaccine greater than or equal to 7yo IM (Completed)        No orders of the defined types were placed in this encounter.   Orders Placed This Encounter  Procedures   Tdap vaccine greater than or equal to 7yo IM    Patient Instructions  Tdap today  Work on incorporating exercise into routine.  Recent cholesterol levels were not fasting - this may be why triglycerides were elevated Good to see you today  Return as needed or in 1 year for  next physical.   Follow up plan: Return in about 1 year (around 10/09/2024) for annual exam, prior fasting for blood work.  Eustaquio Boyden, MD

## 2023-10-10 NOTE — Patient Instructions (Addendum)
 Tdap today  Work on incorporating exercise into routine.  Recent cholesterol levels were not fasting - this may be why triglycerides were elevated Good to see you today  Return as needed or in 1 year for next physical.

## 2023-10-10 NOTE — Assessment & Plan Note (Signed)
Weight gain noted. Encouraged healthy diet and lifestyle choices to affect sustainable weight loss.  

## 2024-01-22 ENCOUNTER — Ambulatory Visit: Admitting: Family Medicine

## 2024-01-22 ENCOUNTER — Encounter: Payer: Self-pay | Admitting: Family Medicine

## 2024-01-22 VITALS — BP 128/74 | HR 81 | Temp 98.1°F | Ht 65.0 in | Wt 240.8 lb

## 2024-01-22 DIAGNOSIS — R0989 Other specified symptoms and signs involving the circulatory and respiratory systems: Secondary | ICD-10-CM

## 2024-01-22 NOTE — Progress Notes (Unsigned)
 Feeling well except for a spot located under left ear along the jaw line. It was more noticeable last week, not as much this week.  Present for about 1-2 weeks.  Hasn't happened prev at this site or other side.  No ear pain.  No URI sx, no rhinorrhea or congestion.  No ST.  No dysphagia.  No clear trigger for events.  No fevers.    Meds, vitals, and allergies reviewed.   ROS: Per HPI unless specifically indicated in ROS section   Nad Ncat Neck supple, no LA except for small likely node that isn't ttp, inferior to the L pinna.  No other masses noted.  Skin w/o rash  TM wnl B OP wnl.  Parotid not ttp B

## 2024-01-22 NOTE — Patient Instructions (Signed)
 Likely a small lymph node that got enlarged and started to get better.  Update us  as needed.   Take care.  Glad to see you.

## 2024-01-24 DIAGNOSIS — R0989 Other specified symptoms and signs involving the circulatory and respiratory systems: Secondary | ICD-10-CM | POA: Insufficient documentation

## 2024-01-24 NOTE — Assessment & Plan Note (Signed)
 Benign exam, okay for outpatient f/u.   Ddx dw pt.  Not likely to be a lipoma.  Likely a small lymph node that got enlarged and started to get better.  Update us  as needed.

## 2024-10-04 ENCOUNTER — Other Ambulatory Visit: Payer: BC Managed Care – PPO

## 2024-10-11 ENCOUNTER — Encounter: Payer: BC Managed Care – PPO | Admitting: Family Medicine
